# Patient Record
Sex: Female | Born: 1995 | Hispanic: No | Marital: Single | Smoking: Never smoker
Health system: Southern US, Community
[De-identification: ages and names within clinical notes are randomized; demographics above are authoritative.]

## PROBLEM LIST (undated history)

## (undated) ENCOUNTER — Inpatient Hospital Stay (HOSPITAL_COMMUNITY): Payer: Self-pay

## (undated) DIAGNOSIS — N200 Calculus of kidney: Secondary | ICD-10-CM

## (undated) HISTORY — PX: NO PAST SURGERIES: SHX2092

---

## 2014-05-22 DIAGNOSIS — N200 Calculus of kidney: Secondary | ICD-10-CM

## 2014-05-22 HISTORY — DX: Calculus of kidney: N20.0

## 2014-10-22 LAB — OB RESULTS CONSOLE HIV ANTIBODY (ROUTINE TESTING): HIV: NONREACTIVE

## 2014-10-22 LAB — OB RESULTS CONSOLE HEPATITIS B SURFACE ANTIGEN: Hepatitis B Surface Ag: NEGATIVE

## 2014-10-22 LAB — OB RESULTS CONSOLE RPR: RPR: NONREACTIVE

## 2014-10-22 LAB — OB RESULTS CONSOLE RUBELLA ANTIBODY, IGM: Rubella: IMMUNE

## 2014-10-22 LAB — OB RESULTS CONSOLE GC/CHLAMYDIA
CHLAMYDIA, DNA PROBE: NEGATIVE
GC PROBE AMP, GENITAL: NEGATIVE

## 2014-12-25 ENCOUNTER — Other Ambulatory Visit (HOSPITAL_COMMUNITY): Payer: Self-pay | Admitting: Obstetrics and Gynecology

## 2014-12-25 ENCOUNTER — Encounter (HOSPITAL_COMMUNITY): Payer: Self-pay | Admitting: Obstetrics and Gynecology

## 2014-12-25 ENCOUNTER — Other Ambulatory Visit (HOSPITAL_COMMUNITY): Payer: Self-pay | Admitting: Maternal and Fetal Medicine

## 2014-12-25 DIAGNOSIS — Z3A27 27 weeks gestation of pregnancy: Secondary | ICD-10-CM

## 2014-12-25 DIAGNOSIS — O99213 Obesity complicating pregnancy, third trimester: Secondary | ICD-10-CM

## 2014-12-25 DIAGNOSIS — Z3689 Encounter for other specified antenatal screening: Secondary | ICD-10-CM

## 2015-01-01 ENCOUNTER — Ambulatory Visit (HOSPITAL_COMMUNITY): Payer: Medicaid Other

## 2015-01-21 ENCOUNTER — Ambulatory Visit (HOSPITAL_COMMUNITY): Payer: Medicaid Other

## 2015-01-21 ENCOUNTER — Other Ambulatory Visit (HOSPITAL_COMMUNITY): Payer: Self-pay | Admitting: Obstetrics and Gynecology

## 2015-01-21 ENCOUNTER — Ambulatory Visit (HOSPITAL_COMMUNITY)
Admission: RE | Admit: 2015-01-21 | Discharge: 2015-01-21 | Disposition: A | Discharge status: Home / Self Care | Payer: Medicaid Other | Source: Ambulatory Visit | Attending: Obstetrics and Gynecology | Admitting: Obstetrics and Gynecology

## 2015-01-21 ENCOUNTER — Encounter (HOSPITAL_COMMUNITY): Payer: Self-pay

## 2015-01-21 DIAGNOSIS — Z3689 Encounter for other specified antenatal screening: Secondary | ICD-10-CM

## 2015-01-21 DIAGNOSIS — Z36 Encounter for antenatal screening of mother: Secondary | ICD-10-CM | POA: Diagnosis not present

## 2015-01-21 DIAGNOSIS — O99213 Obesity complicating pregnancy, third trimester: Secondary | ICD-10-CM | POA: Insufficient documentation

## 2015-01-21 DIAGNOSIS — Z3A27 27 weeks gestation of pregnancy: Secondary | ICD-10-CM

## 2015-02-01 ENCOUNTER — Other Ambulatory Visit (HOSPITAL_COMMUNITY): Payer: Self-pay | Admitting: Obstetrics and Gynecology

## 2015-02-01 ENCOUNTER — Encounter (HOSPITAL_COMMUNITY): Payer: Self-pay | Admitting: Obstetrics and Gynecology

## 2015-02-17 ENCOUNTER — Inpatient Hospital Stay (HOSPITAL_COMMUNITY): Payer: Medicaid Other

## 2015-02-17 ENCOUNTER — Inpatient Hospital Stay (HOSPITAL_COMMUNITY)
Admission: AD | Admit: 2015-02-17 | Discharge: 2015-02-17 | Disposition: A | Discharge status: Home / Self Care | Payer: Medicaid Other | Source: Ambulatory Visit | Attending: Obstetrics and Gynecology | Admitting: Obstetrics and Gynecology

## 2015-02-17 ENCOUNTER — Encounter (HOSPITAL_COMMUNITY): Payer: Self-pay | Admitting: *Deleted

## 2015-02-17 DIAGNOSIS — O26899 Other specified pregnancy related conditions, unspecified trimester: Secondary | ICD-10-CM

## 2015-02-17 DIAGNOSIS — R1011 Right upper quadrant pain: Secondary | ICD-10-CM | POA: Insufficient documentation

## 2015-02-17 DIAGNOSIS — N200 Calculus of kidney: Secondary | ICD-10-CM | POA: Diagnosis not present

## 2015-02-17 DIAGNOSIS — O26893 Other specified pregnancy related conditions, third trimester: Secondary | ICD-10-CM | POA: Diagnosis not present

## 2015-02-17 DIAGNOSIS — R319 Hematuria, unspecified: Secondary | ICD-10-CM

## 2015-02-17 DIAGNOSIS — Z3A31 31 weeks gestation of pregnancy: Secondary | ICD-10-CM | POA: Diagnosis not present

## 2015-02-17 DIAGNOSIS — O9989 Other specified diseases and conditions complicating pregnancy, childbirth and the puerperium: Secondary | ICD-10-CM

## 2015-02-17 DIAGNOSIS — R109 Unspecified abdominal pain: Secondary | ICD-10-CM

## 2015-02-17 LAB — URINALYSIS, ROUTINE W REFLEX MICROSCOPIC
Bilirubin Urine: NEGATIVE
Glucose, UA: NEGATIVE mg/dL
KETONES UR: 15 mg/dL — AB
LEUKOCYTES UA: NEGATIVE
NITRITE: NEGATIVE
PH: 6.5 (ref 5.0–8.0)
Protein, ur: NEGATIVE mg/dL
SPECIFIC GRAVITY, URINE: 1.025 (ref 1.005–1.030)
Urobilinogen, UA: 0.2 mg/dL (ref 0.0–1.0)

## 2015-02-17 LAB — CBC
HCT: 32.5 % — ABNORMAL LOW (ref 36.0–46.0)
Hemoglobin: 11 g/dL — ABNORMAL LOW (ref 12.0–15.0)
MCH: 29.3 pg (ref 26.0–34.0)
MCHC: 33.8 g/dL (ref 30.0–36.0)
MCV: 86.4 fL (ref 78.0–100.0)
Platelets: 194 10*3/uL (ref 150–400)
RBC: 3.76 MIL/uL — ABNORMAL LOW (ref 3.87–5.11)
RDW: 13.3 % (ref 11.5–15.5)
WBC: 12.2 10*3/uL — ABNORMAL HIGH (ref 4.0–10.5)

## 2015-02-17 LAB — COMPREHENSIVE METABOLIC PANEL
ALBUMIN: 2.8 g/dL — AB (ref 3.5–5.0)
ALT: 11 U/L — ABNORMAL LOW (ref 14–54)
ANION GAP: 8 (ref 5–15)
AST: 32 U/L (ref 15–41)
Alkaline Phosphatase: 88 U/L (ref 38–126)
BUN: 9 mg/dL (ref 6–20)
CO2: 21 mmol/L — AB (ref 22–32)
Calcium: 8.2 mg/dL — ABNORMAL LOW (ref 8.9–10.3)
Chloride: 106 mmol/L (ref 101–111)
Creatinine, Ser: 0.59 mg/dL (ref 0.44–1.00)
GFR calc Af Amer: >60 mL/min (ref >60)
GFR calc non Af Amer: >60 mL/min (ref >60)
GLUCOSE: 95 mg/dL (ref 65–99)
POTASSIUM: 4.9 mmol/L (ref 3.5–5.1)
SODIUM: 135 mmol/L (ref 135–145)
Total Bilirubin: 1.1 mg/dL (ref 0.3–1.2)
Total Protein: 6.3 g/dL — ABNORMAL LOW (ref 6.5–8.1)

## 2015-02-17 LAB — URINE MICROSCOPIC-ADD ON

## 2015-02-17 MED ORDER — HYDROMORPHONE HCL 1 MG/ML IJ SOLN
1.0000 mg | Freq: Once | INTRAMUSCULAR | Status: AC
  Administered 2015-02-17: 1 mg via INTRAVENOUS
  Filled 2015-02-17: qty 1

## 2015-02-17 MED ORDER — LACTATED RINGERS IV BOLUS (SEPSIS)
1000.0000 mL | Freq: Once | INTRAVENOUS | Status: AC
Start: 2015-02-17 — End: 2015-02-17
  Administered 2015-02-17: 1000 mL via INTRAVENOUS

## 2015-02-17 MED ORDER — OXYCODONE-ACETAMINOPHEN 5-325 MG PO TABS: 1.0000 | ORAL_TABLET | Freq: Four times a day (QID) | ORAL | Status: DC | PRN

## 2015-02-17 MED ORDER — TAMSULOSIN HCL 0.4 MG PO CAPS: 0.4000 mg | ORAL_CAPSULE | Freq: Every day | ORAL | Status: DC

## 2015-02-17 MED ORDER — ONDANSETRON HCL 4 MG/2ML IJ SOLN
4.0000 mg | Freq: Once | INTRAMUSCULAR | Status: AC
  Administered 2015-02-17: 4 mg via INTRAVENOUS
  Filled 2015-02-17: qty 2

## 2015-02-17 NOTE — Discharge Instructions (Signed)

## 2015-02-17 NOTE — MAU Provider Note (Signed)
History     CSN: [id number]  Arrival date and time: 02/17/15 4098   First Provider Initiated Contact with Patient 02/17/15 0102      Chief Complaint  Patient presents with  . Abdominal Pain   HPI Ms. Tammy Mcclain is a 19 y.o. G1P0 at [redacted]w[redacted]d who presents to MAU today with complaint of mid right sided abdominal pain radiating into the RUQ that started this evening. She states that she called the on-call RN and was told to try Tylenol. She took 2 Tylenol around 2200 today with some initial relief. Pain returned and became worse by 2300. She states that she then began to have associated N/V. She denies fever or contractions. She states that the pain is constant and rated at 10/10 now. She denies vaginal bleeding or LOF. She reports good fetal movement.   OB History    Gravida Para Term Preterm AB TAB SAB Ectopic Multiple Living   1               History reviewed. No pertinent past medical history.  History reviewed. No pertinent past surgical history.  History reviewed. No pertinent family history.  Social History  Substance Use Topics  . Smoking status: Never Smoker   . Smokeless tobacco: Never Used  . Alcohol Use: No    Allergies: No Known Allergies  No prescriptions prior to admission    Review of Systems  Constitutional: Negative for fever and malaise/fatigue.  Gastrointestinal: Positive for nausea, vomiting and abdominal pain. Negative for diarrhea and constipation.  Genitourinary: Negative for dysuria, urgency and frequency.       Neg - vaginal bleeding, discharge, LOF   Physical Exam   Blood pressure 99/59, pulse 101, temperature 98 F (36.7 C), resp. rate 18, last menstrual period 07/14/2014.  Physical Exam  Nursing note and vitals reviewed. Constitutional: She is oriented to person, place, and time. She appears well-developed and well-nourished. No distress.  HENT:  Head: Normocephalic and atraumatic.  Cardiovascular: Normal rate.   Respiratory:  Effort normal.  GI: Soft. She exhibits no distension and no mass. There is tenderness (moderate tenderness to palpation of the right mid abdomen and RUQ). There is no rebound and no guarding.  Neurological: She is alert and oriented to person, place, and time.  Skin: Skin is warm and dry. No erythema.  Psychiatric: She has a normal mood and affect.  Dilation: Closed Effacement (%): Thick Exam by:: Vonzella Nipple PA   Results for orders placed or performed during the hospital encounter of 02/17/15 (from the past 24 hour(s))  CBC     Status: Abnormal   Collection Time: 02/17/15  1:10 AM  Result Value Ref Range   WBC 12.2 (H) 4.0 - 10.5 K/uL   RBC 3.76 (L) 3.87 - 5.11 MIL/uL   Hemoglobin 11.0 (L) 12.0 - 15.0 g/dL   HCT 11.9 (L) 14.7 - 82.9 %   MCV 86.4 78.0 - 100.0 fL   MCH 29.3 26.0 - 34.0 pg   MCHC 33.8 30.0 - 36.0 g/dL   RDW 56.2 13.0 - 86.5 %   Platelets 194 150 - 400 K/uL  Comprehensive metabolic panel     Status: Abnormal   Collection Time: 02/17/15  1:10 AM  Result Value Ref Range   Sodium 135 135 - 145 mmol/L   Potassium 4.9 3.5 - 5.1 mmol/L   Chloride 106 101 - 111 mmol/L   CO2 21 (L) 22 - 32 mmol/L   Glucose, Bld 95 65 -  99 mg/dL   BUN 9 6 - 20 mg/dL   Creatinine, Ser 1.61 0.44 - 1.00 mg/dL   Calcium 8.2 (L) 8.9 - 10.3 mg/dL   Total Protein 6.3 (L) 6.5 - 8.1 g/dL   Albumin 2.8 (L) 3.5 - 5.0 g/dL   AST 32 15 - 41 U/L   ALT 11 (L) 14 - 54 U/L   Alkaline Phosphatase 88 38 - 126 U/L   Total Bilirubin 1.1 0.3 - 1.2 mg/dL   GFR calc non Af Amer >60 >60 mL/min   GFR calc Af Amer >60 >60 mL/min   Anion gap 8 5 - 15  Urinalysis, Routine w reflex microscopic (not at [facility])     Status: Abnormal   Collection Time: 02/17/15  2:25 AM  Result Value Ref Range   Color, Urine YELLOW YELLOW   APPearance CLEAR CLEAR   Specific Gravity, Urine 1.025 1.005 - 1.030   pH 6.5 5.0 - 8.0   Glucose, UA NEGATIVE NEGATIVE mg/dL   Hgb urine dipstick LARGE (A) NEGATIVE   Bilirubin Urine  NEGATIVE NEGATIVE   Ketones, ur 15 (A) NEGATIVE mg/dL   Protein, ur NEGATIVE NEGATIVE mg/dL   Urobilinogen, UA 0.2 0.0 - 1.0 mg/dL   Nitrite NEGATIVE NEGATIVE   Leukocytes, UA NEGATIVE NEGATIVE  Urine microscopic-add on     Status: None   Collection Time: 02/17/15  2:25 AM  Result Value Ref Range   Squamous Epithelial / LPF RARE RARE   WBC, UA 0-2 <3 WBC/hpf   RBC / HPF 21-50 <3 RBC/hpf   Bacteria, UA RARE RARE   US Renal  02/17/2015   CLINICAL DATA:  Hematuria.  Right-sided flank pain  EXAM: RENAL / URINARY TRACT ULTRASOUND COMPLETE  COMPARISON:  None.  FINDINGS: Right Kidney:  Length: 11 cm. Mild to moderate hydronephrosis without visualized obstructive process. No sonographic evidence of nephrolithiasis. No mass or fluid collection. Although hydronephrosis could be from gravid uterus, given the hematuria and flank pain this is more concerning for a ureteral calculus.  Left Kidney:  Length: 11 cm. Echogenicity within normal limits. No mass or hydronephrosis visualized.  Bladder:  Completely decompressed and not evaluated. Unfortunately, this precludes detection of ureteral jets.  IMPRESSION: Right hydronephrosis without visualized obstruction, as above.   Electronically Signed   By: Marnee Spring M.D.   On: 02/17/2015 03:36   US Abdomen Limited Ruq  02/17/2015   CLINICAL DATA:  Abdominal pain in third trimester pregnancy.  EXAM: US ABDOMEN LIMITED - RIGHT UPPER QUADRANT  COMPARISON:  None.  FINDINGS: Gallbladder:  No gallstones or wall thickening visualized. No sonographic Murphy sign noted.  Common bile duct:  Diameter: 4 mm  Liver:  Limited evaluation due to narrow sonographic windows. No focal lesion identified. Within normal limits in parenchymal echogenicity.  IMPRESSION: Negative right upper quadrant ultrasound.   Electronically Signed   By: Marnee Spring M.D.   On: 02/17/2015 02:22    Fetal Monitoring: Baseline: 120 bpm, moderate variability, + accelerations, no  decelerations Contractions: none  MAU Course  Procedures None  MDM UA, CBC, CMP, US OB Limited and [place] RUQ abdomen limited today IV LR bolus with 4 mg IV Zofran and 1 mg IV Dilaudid given US shows normal AFI and FHR RUQ [place] results above Patient reports significant improvement in pain, no additional emesis Discussed with Dr. Jackelyn Knife. He recommends patient attempt a PO challenge. If able to tolerate PO patient may be discharged with Rx for Percocet and Flomax and follow-up  in the office as scheduled or sooner PRN.   Assessment and Plan  A: SIUP at [redacted]w[redacted]d Right kidney stone  P: Discharge home Rx for Flomax and Percocet given to patient Warning signs for worsening condition discussed Patient advised to follow-up with [facility] as scheduled for routine prenatal care or sooner PRN Patient may return to MAU as needed or if her condition were to change or worsen   Marny Lowenstein, PA-C  02/17/2015, 3:39 AM

## 2015-02-17 NOTE — MAU Note (Signed)
Pt presents to MAU with complaints of pain her there lower right side of her abdomen since 10 pm tonight. Also states that she started vomiting shortly after 10. Denies any vaginal bleeding or discharge

## 2015-02-22 NOTE — Progress Notes (Signed)
FHT from 9-28 reviewed.  Reactive NST, no significant decels, no regular ctx

## 2015-03-18 ENCOUNTER — Ambulatory Visit (INDEPENDENT_AMBULATORY_CARE_PROVIDER_SITE_OTHER): Payer: Self-pay | Admitting: Pediatrics

## 2015-03-18 DIAGNOSIS — Z7681 Expectant parent(s) prebirth pediatrician visit: Secondary | ICD-10-CM

## 2015-03-18 DIAGNOSIS — Z3490 Encounter for supervision of normal pregnancy, unspecified, unspecified trimester: Secondary | ICD-10-CM | POA: Insufficient documentation

## 2015-03-18 NOTE — Progress Notes (Signed)
Prenatal counseling for impending newborn done  

## 2015-03-25 LAB — OB RESULTS CONSOLE GBS: STREP GROUP B AG: POSITIVE

## 2015-04-21 ENCOUNTER — Inpatient Hospital Stay (HOSPITAL_COMMUNITY)
Admission: AD | Admit: 2015-04-21 | Discharge: 2015-04-25 | DRG: 765 | Disposition: A | Discharge status: Home / Self Care | Payer: Medicaid Other | Source: Ambulatory Visit | Attending: Obstetrics and Gynecology | Admitting: Obstetrics and Gynecology

## 2015-04-21 ENCOUNTER — Inpatient Hospital Stay (HOSPITAL_COMMUNITY): Payer: Medicaid Other | Admitting: Anesthesiology

## 2015-04-21 ENCOUNTER — Encounter (HOSPITAL_COMMUNITY): Payer: Self-pay | Admitting: *Deleted

## 2015-04-21 DIAGNOSIS — O99214 Obesity complicating childbirth: Secondary | ICD-10-CM | POA: Diagnosis present

## 2015-04-21 DIAGNOSIS — O99824 Streptococcus B carrier state complicating childbirth: Secondary | ICD-10-CM | POA: Diagnosis present

## 2015-04-21 DIAGNOSIS — Z6841 Body Mass Index (BMI) 40.0 and over, adult: Secondary | ICD-10-CM

## 2015-04-21 DIAGNOSIS — O480 Post-term pregnancy: Secondary | ICD-10-CM | POA: Diagnosis present

## 2015-04-21 DIAGNOSIS — Z3A40 40 weeks gestation of pregnancy: Secondary | ICD-10-CM | POA: Diagnosis not present

## 2015-04-21 DIAGNOSIS — O2653 Maternal hypotension syndrome, third trimester: Secondary | ICD-10-CM | POA: Diagnosis present

## 2015-04-21 DIAGNOSIS — Z3400 Encounter for supervision of normal first pregnancy, unspecified trimester: Secondary | ICD-10-CM

## 2015-04-21 HISTORY — DX: Calculus of kidney: N20.0

## 2015-04-21 LAB — CBC
HCT: 36.1 % (ref 36.0–46.0)
HEMOGLOBIN: 12.3 g/dL (ref 12.0–15.0)
MCH: 28.5 pg (ref 26.0–34.0)
MCHC: 34.1 g/dL (ref 30.0–36.0)
MCV: 83.6 fL (ref 78.0–100.0)
Platelets: 218 10*3/uL (ref 150–400)
RBC: 4.32 MIL/uL (ref 3.87–5.11)
RDW: 13.8 % (ref 11.5–15.5)
WBC: 12 10*3/uL — AB (ref 4.0–10.5)

## 2015-04-21 MED ORDER — EPHEDRINE 5 MG/ML INJ
10.0000 mg | INTRAVENOUS | Status: AC | PRN
  Administered 2015-04-21 (×2): 5 mg via INTRAVENOUS
  Filled 2015-04-21: qty 4

## 2015-04-21 MED ORDER — CITRIC ACID-SODIUM CITRATE 334-500 MG/5ML PO SOLN
30.0000 mL | ORAL | Status: DC | PRN
  Administered 2015-04-22: 30 mL via ORAL
  Filled 2015-04-21: qty 15

## 2015-04-21 MED ORDER — BUTORPHANOL TARTRATE 1 MG/ML IJ SOLN: 1.0000 mg | INTRAMUSCULAR | Status: DC | PRN

## 2015-04-21 MED ORDER — ONDANSETRON HCL 4 MG/2ML IJ SOLN: 4.0000 mg | Freq: Four times a day (QID) | INTRAMUSCULAR | Status: DC | PRN

## 2015-04-21 MED ORDER — EPHEDRINE 5 MG/ML INJ
10.0000 mg | Freq: Once | INTRAVENOUS | Status: AC
  Administered 2015-04-21: 10 mg via INTRAVENOUS

## 2015-04-21 MED ORDER — OXYTOCIN BOLUS FROM INFUSION
500.0000 mL | INTRAVENOUS | Status: DC
Start: 2015-04-21 — End: 2015-04-22

## 2015-04-21 MED ORDER — PHENYLEPHRINE 40 MCG/ML (10ML) SYRINGE FOR IV PUSH (FOR BLOOD PRESSURE SUPPORT)
80.0000 ug | PREFILLED_SYRINGE | INTRAVENOUS | Status: AC | PRN
  Administered 2015-04-21: 120 ug via INTRAVENOUS
  Administered 2015-04-21 (×2): 80 ug via INTRAVENOUS
  Filled 2015-04-21: qty 20

## 2015-04-21 MED ORDER — TERBUTALINE SULFATE 1 MG/ML IJ SOLN
0.2500 mg | Freq: Once | INTRAMUSCULAR | Status: AC | PRN
  Administered 2015-04-22: 0.25 mg via SUBCUTANEOUS

## 2015-04-21 MED ORDER — PENICILLIN G POTASSIUM 5000000 UNITS IJ SOLR
2.5000 10*6.[IU] | INTRAVENOUS | Status: DC
  Administered 2015-04-21 (×3): 2.5 10*6.[IU] via INTRAVENOUS
  Filled 2015-04-21 (×7): qty 2.5

## 2015-04-21 MED ORDER — LIDOCAINE HCL (PF) 1 % IJ SOLN: 30.0000 mL | INTRAMUSCULAR | Status: DC | PRN

## 2015-04-21 MED ORDER — OXYTOCIN 40 UNITS IN LACTATED RINGERS INFUSION - SIMPLE MED
1.0000 m[IU]/min | INTRAVENOUS | Status: DC
  Administered 2015-04-21: 2 m[IU]/min via INTRAVENOUS
  Filled 2015-04-21: qty 1000

## 2015-04-21 MED ORDER — LACTATED RINGERS IV SOLN
INTRAVENOUS | Status: DC
  Administered 2015-04-21 – 2015-04-22 (×7): via INTRAVENOUS

## 2015-04-21 MED ORDER — LIDOCAINE HCL (PF) 1 % IJ SOLN
INTRAMUSCULAR | Status: DC | PRN
  Administered 2015-04-21 (×2): 4 mL

## 2015-04-21 MED ORDER — PENICILLIN G POTASSIUM 5000000 UNITS IJ SOLR
5.0000 10*6.[IU] | Freq: Once | INTRAVENOUS | Status: AC
  Administered 2015-04-21: 5 10*6.[IU] via INTRAVENOUS
  Filled 2015-04-21: qty 5

## 2015-04-21 MED ORDER — PHENYLEPHRINE 40 MCG/ML (10ML) SYRINGE FOR IV PUSH (FOR BLOOD PRESSURE SUPPORT)
80.0000 ug | PREFILLED_SYRINGE | Freq: Three times a day (TID) | INTRAVENOUS | Status: DC | PRN
  Administered 2015-04-21: 80 ug via INTRAVENOUS
  Administered 2015-04-21: 40 ug via INTRAVENOUS
  Administered 2015-04-21: 80 ug via INTRAVENOUS
  Filled 2015-04-21: qty 2
  Filled 2015-04-21: qty 20

## 2015-04-21 MED ORDER — OXYTOCIN 40 UNITS IN LACTATED RINGERS INFUSION - SIMPLE MED
62.5000 mL/h | INTRAVENOUS | Status: DC
Start: 2015-04-21 — End: 2015-04-22

## 2015-04-21 MED ORDER — LACTATED RINGERS IV SOLN
500.0000 mL | INTRAVENOUS | Status: DC | PRN
  Administered 2015-04-21: 300 mL via INTRAVENOUS
  Administered 2015-04-21: 500 mL via INTRAVENOUS
  Administered 2015-04-21: 300 mL via INTRAVENOUS
  Administered 2015-04-21: 500 mL via INTRAVENOUS

## 2015-04-21 MED ORDER — EPHEDRINE SULFATE 50 MG/ML IJ SOLN
INTRAMUSCULAR | Status: AC
  Filled 2015-04-21: qty 1

## 2015-04-21 MED ORDER — FENTANYL 2.5 MCG/ML BUPIVACAINE 1/10 % EPIDURAL INFUSION (WH - ANES)
14.0000 mL/h | INTRAMUSCULAR | Status: DC | PRN
  Administered 2015-04-21 – 2015-04-22 (×2): 14 mL/h via EPIDURAL
  Filled 2015-04-21 (×2): qty 125

## 2015-04-21 MED ORDER — OXYCODONE-ACETAMINOPHEN 5-325 MG PO TABS: 2.0000 | ORAL_TABLET | ORAL | Status: DC | PRN

## 2015-04-21 MED ORDER — OXYCODONE-ACETAMINOPHEN 5-325 MG PO TABS: 1.0000 | ORAL_TABLET | ORAL | Status: DC | PRN

## 2015-04-21 MED ORDER — EPHEDRINE SULFATE 50 MG/ML IJ SOLN
INTRAMUSCULAR | Status: DC | PRN
  Administered 2015-04-21: 25 mg via INTRAMUSCULAR

## 2015-04-21 MED ORDER — DIPHENHYDRAMINE HCL 50 MG/ML IJ SOLN: 12.5000 mg | INTRAMUSCULAR | Status: DC | PRN

## 2015-04-21 NOTE — Progress Notes (Signed)
Feeling some ctx Afeb, VSS FHT- Cat I VE-3/80/-2, vtx, AROM clear Continue pitocin and PCN, monitor progress

## 2015-04-21 NOTE — Anesthesia Preprocedure Evaluation (Signed)
Anesthesia Evaluation  Patient identified by MRN, date of birth, ID band Patient awake    Reviewed: Allergy & Precautions, NPO status , Patient's Chart, lab work & pertinent test results  History of Anesthesia Complications Negative for: history of anesthetic complications  Airway Mallampati: II  TM Distance: >3 FB Neck ROM: Full    Dental no notable dental hx. (+) Dental Advisory Given   Pulmonary neg pulmonary ROS,    Pulmonary exam normal breath sounds clear to auscultation       Cardiovascular negative cardio ROS Normal cardiovascular exam Rhythm:Regular Rate:Normal     Neuro/Psych negative neurological ROS  negative psych ROS   GI/Hepatic negative GI ROS, Neg liver ROS,   Endo/Other  Morbid obesity  Renal/GU negative Renal ROS  negative genitourinary   Musculoskeletal negative musculoskeletal ROS (+)   Abdominal   Peds negative pediatric ROS (+)  Hematology negative hematology ROS (+)   Anesthesia Other Findings   Reproductive/Obstetrics (+) Pregnancy                             Anesthesia Physical Anesthesia Plan  ASA: III  Anesthesia Plan: Epidural   Post-op Pain Management:    Induction:   Airway Management Planned:   Additional Equipment:   Intra-op Plan:   Post-operative Plan:   Informed Consent: I have reviewed the patients History and Physical, chart, labs and discussed the procedure including the risks, benefits and alternatives for the proposed anesthesia with the patient or authorized representative who has indicated his/her understanding and acceptance.   Dental advisory given  Plan Discussed with: CRNA  Anesthesia Plan Comments:         Anesthesia Quick Evaluation  

## 2015-04-21 NOTE — H&P (Signed)
Tammy Mcclain Whichard is a 19 y.o. female, G1P0, [facility]redacted[facility] for induction with favorable cervix.  Prenatal care essentially uncomplicated.  Maternal Medical History:  Fetal activity: Perceived fetal activity is normal.    Prenatal complications: no prenatal complications   OB History    Gravida Para Term Preterm AB TAB SAB Ectopic Multiple Living   1              Past Medical History  Diagnosis Date  . Kidney stones 2016   Past Surgical History  Procedure Laterality Date  . No past surgeries     Family History: family history is not on file. Social History:  reports that she has never smoked. She has never used smokeless tobacco. She reports that she does not drink alcohol or use illicit drugs.   Prenatal Transfer Tool  Maternal Diabetes: No Genetic Screening: Normal Maternal Ultrasounds/Referrals: Normal Fetal Ultrasounds or other Referrals:  None Maternal Substance Abuse:  No Significant Maternal Medications:  None Significant Maternal Lab Results:  Lab values include: Group B Strep positive Other Comments:  None  Review of Systems  Respiratory: Negative.   Cardiovascular: Negative.     Dilation: 2.5 Effacement (%): 60 Station: -2 Exam by:: Honeycutt, RN Blood pressure 124/70, pulse 109, temperature 99 F (37.2 C), temperature source Oral, resp. rate 18, height 5\' 2"  (1.575 m), weight 134.265 kg (296 lb), last menstrual period 07/14/2014. Maternal Exam:  Uterine Assessment: Contraction strength is mild.  Contraction frequency is irregular.   Abdomen: Patient reports no abdominal tenderness. Estimated fetal weight is 7 lbs.   Fetal presentation: vertex  Introitus: Normal vulva. Normal vagina.  Amniotic fluid character: not assessed.  Pelvis: adequate for delivery.   Cervix: Cervix evaluated by digital exam.     Fetal Exam Fetal Monitor Review: Mode: ultrasound.   Variability: moderate (6-25 bpm).   Pattern: accelerations present  and no decelerations.    Fetal State Assessment: Category I - tracings are normal.     Physical Exam  Vitals reviewed. Constitutional: She appears well-developed and well-nourished.  Cardiovascular: Normal rate, regular rhythm and normal heart sounds.   No murmur heard. Respiratory: Effort normal and breath sounds normal. No respiratory distress. She has no wheezes.  GI: Soft.    Prenatal labs: ABO, Rh:  A neg Antibody:  neg Rubella:  Immune RPR:   NR HBsAg:   Neg HIV:   NR GBS:  Pos   Assessment/Plan: IUP at 40 weeks, favorable cervix, +GBS, for induction.  On pitocin and PCN, will monitor progress, check cervix shortly for probable AROM.   Autumne Kallio D 04/21/2015, 12:29 PM

## 2015-04-21 NOTE — Progress Notes (Signed)
Comfortable with epidural Afeb, VSS FHT- Cat II, mod variability, intermittent variable decels recently, had issues from hypotension after epidural, + accels VE-4/80/-2, vtx, IUPC inserted, fluid is light meconium Continue pitocin and PCN, monitor progress

## 2015-04-21 NOTE — Anesthesia Procedure Notes (Signed)
Epidural Patient location during procedure: OB  Staffing Anesthesiologist: Katherine Tout Performed by: anesthesiologist   Preanesthetic Checklist Completed: patient identified, site marked, surgical consent, pre-op evaluation, timeout performed, IV checked, risks and benefits discussed and monitors and equipment checked  Epidural Patient position: sitting Prep: site prepped and draped and DuraPrep Patient monitoring: continuous pulse ox and blood pressure Approach: midline Location: L3-L4 Injection technique: LOR saline  Needle:  Needle type: Tuohy  Needle gauge: 17 G Needle length: 9 cm and 9 Needle insertion depth: 9 cm Catheter type: closed end flexible Catheter size: 19 Gauge Catheter at skin depth: 15 cm Test dose: negative  Assessment Events: blood not aspirated, injection not painful, no injection resistance, negative IV test and no paresthesia  Additional Notes Patient identified. Risks/Benefits/Options discussed with patient including but not limited to bleeding, infection, nerve damage, paralysis, failed block, incomplete pain control, headache, blood pressure changes, nausea, vomiting, reactions to medication both or allergic, itching and postpartum back pain. Confirmed with bedside nurse the patient's most recent platelet count. Confirmed with patient that they are not currently taking any anticoagulation, have any bleeding history or any family history of bleeding disorders. Patient expressed understanding and wished to proceed. All questions were answered. Sterile technique was used throughout the entire procedure. Please see nursing notes for vital signs. Test dose was given through epidural catheter and negative prior to continuing to dose epidural or start infusion. Warning signs of high block given to the patient including shortness of breath, tingling/numbness in hands, complete motor block, or any concerning symptoms with instructions to call for help. Patient was  given instructions on fall risk and not to get out of bed. All questions and concerns addressed with instructions to call with any issues or inadequate analgesia.      

## 2015-04-22 ENCOUNTER — Encounter (HOSPITAL_COMMUNITY): Payer: Self-pay | Admitting: Anesthesiology

## 2015-04-22 ENCOUNTER — Encounter (HOSPITAL_COMMUNITY)
Admission: AD | Disposition: A | Discharge status: Home / Self Care | Payer: Self-pay | Source: Ambulatory Visit | Attending: Obstetrics and Gynecology

## 2015-04-22 LAB — RPR: RPR: NONREACTIVE

## 2015-04-22 LAB — CBC
HCT: 28.9 % — ABNORMAL LOW (ref 36.0–46.0)
HEMOGLOBIN: 9.7 g/dL — AB (ref 12.0–15.0)
MCH: 28.4 pg (ref 26.0–34.0)
MCHC: 33.6 g/dL (ref 30.0–36.0)
MCV: 84.8 fL (ref 78.0–100.0)
Platelets: 160 10*3/uL (ref 150–400)
RBC: 3.41 MIL/uL — AB (ref 3.87–5.11)
RDW: 13.9 % (ref 11.5–15.5)
WBC: 14.6 10*3/uL — ABNORMAL HIGH (ref 4.0–10.5)

## 2015-04-22 SURGERY — Surgical Case
Anesthesia: Epidural

## 2015-04-22 MED ORDER — LANOLIN HYDROUS EX OINT: 1.0000 "application " | TOPICAL_OINTMENT | CUTANEOUS | Status: DC | PRN

## 2015-04-22 MED ORDER — SODIUM CHLORIDE 0.9 % IJ SOLN: 3.0000 mL | INTRAMUSCULAR | Status: DC | PRN

## 2015-04-22 MED ORDER — PROMETHAZINE HCL 25 MG/ML IJ SOLN: 6.2500 mg | INTRAMUSCULAR | Status: DC | PRN

## 2015-04-22 MED ORDER — ONDANSETRON HCL 4 MG/2ML IJ SOLN
INTRAMUSCULAR | Status: AC
  Filled 2015-04-22: qty 2

## 2015-04-22 MED ORDER — ONDANSETRON HCL 4 MG/2ML IJ SOLN: 4.0000 mg | Freq: Three times a day (TID) | INTRAMUSCULAR | Status: DC | PRN

## 2015-04-22 MED ORDER — LIDOCAINE-EPINEPHRINE (PF) 2 %-1:200000 IJ SOLN
INTRAMUSCULAR | Status: AC
  Filled 2015-04-22: qty 20

## 2015-04-22 MED ORDER — PHENYLEPHRINE HCL 10 MG/ML IJ SOLN
INTRAMUSCULAR | Status: DC | PRN
  Administered 2015-04-22 (×2): 80 ug via INTRAVENOUS
  Administered 2015-04-22: 40 ug via INTRAVENOUS

## 2015-04-22 MED ORDER — OXYTOCIN 40 UNITS IN LACTATED RINGERS INFUSION - SIMPLE MED
62.5000 mL/h | INTRAVENOUS | Status: AC
  Administered 2015-04-22: 62.5 mL/h via INTRAVENOUS
  Filled 2015-04-22: qty 1000

## 2015-04-22 MED ORDER — PRENATAL MULTIVITAMIN CH
1.0000 | ORAL_TABLET | Freq: Every day | ORAL | Status: DC
  Administered 2015-04-22 – 2015-04-25 (×4): 1 via ORAL
  Filled 2015-04-22 (×4): qty 1

## 2015-04-22 MED ORDER — MAGNESIUM HYDROXIDE 400 MG/5ML PO SUSP: 30.0000 mL | ORAL | Status: DC | PRN

## 2015-04-22 MED ORDER — LACTATED RINGERS IV SOLN
INTRAVENOUS | Status: DC
Start: 2015-04-22 — End: 2015-04-25
  Administered 2015-04-22 – 2015-04-23 (×2): via INTRAVENOUS

## 2015-04-22 MED ORDER — ZOLPIDEM TARTRATE 5 MG PO TABS: 5.0000 mg | ORAL_TABLET | Freq: Every evening | ORAL | Status: DC | PRN

## 2015-04-22 MED ORDER — LACTATED RINGERS IV SOLN
INTRAVENOUS | Status: DC | PRN
  Administered 2015-04-22 (×2): via INTRAVENOUS

## 2015-04-22 MED ORDER — HYDROMORPHONE HCL 2 MG PO TABS: 2.0000 mg | ORAL_TABLET | ORAL | Status: DC | PRN

## 2015-04-22 MED ORDER — SCOPOLAMINE 1 MG/3DAYS TD PT72
MEDICATED_PATCH | TRANSDERMAL | Status: AC
  Filled 2015-04-22: qty 1

## 2015-04-22 MED ORDER — NALOXONE HCL 0.4 MG/ML IJ SOLN: 0.4000 mg | INTRAMUSCULAR | Status: DC | PRN

## 2015-04-22 MED ORDER — ONDANSETRON HCL 4 MG/2ML IJ SOLN
INTRAMUSCULAR | Status: DC | PRN
Start: 2015-04-22 — End: 2015-04-22
  Administered 2015-04-22: 4 mg via INTRAVENOUS

## 2015-04-22 MED ORDER — FENTANYL CITRATE (PF) 100 MCG/2ML IJ SOLN
INTRAMUSCULAR | Status: DC | PRN
  Administered 2015-04-22: 100 ug via INTRAVENOUS

## 2015-04-22 MED ORDER — SODIUM BICARBONATE 8.4 % IV SOLN
INTRAVENOUS | Status: AC
  Filled 2015-04-22: qty 50

## 2015-04-22 MED ORDER — MEPERIDINE HCL 25 MG/ML IJ SOLN: 6.2500 mg | INTRAMUSCULAR | Status: DC | PRN

## 2015-04-22 MED ORDER — ACETAMINOPHEN 325 MG PO TABS
650.0000 mg | ORAL_TABLET | ORAL | Status: DC | PRN
  Administered 2015-04-23 – 2015-04-24 (×7): 650 mg via ORAL
  Filled 2015-04-22 (×7): qty 2

## 2015-04-22 MED ORDER — MENTHOL 3 MG MT LOZG: 1.0000 | LOZENGE | OROMUCOSAL | Status: DC | PRN

## 2015-04-22 MED ORDER — NALBUPHINE HCL 10 MG/ML IJ SOLN: 5.0000 mg | INTRAMUSCULAR | Status: DC | PRN

## 2015-04-22 MED ORDER — NALBUPHINE HCL 10 MG/ML IJ SOLN: 5.0000 mg | Freq: Once | INTRAMUSCULAR | Status: DC | PRN

## 2015-04-22 MED ORDER — SENNOSIDES-DOCUSATE SODIUM 8.6-50 MG PO TABS
2.0000 | ORAL_TABLET | ORAL | Status: DC
  Administered 2015-04-23 – 2015-04-24 (×2): 2 via ORAL
  Filled 2015-04-22 (×4): qty 2

## 2015-04-22 MED ORDER — DIPHENHYDRAMINE HCL 50 MG/ML IJ SOLN: 12.5000 mg | INTRAMUSCULAR | Status: DC | PRN

## 2015-04-22 MED ORDER — MEASLES, MUMPS & RUBELLA VAC ~~LOC~~ INJ
0.5000 mL | INJECTION | Freq: Once | SUBCUTANEOUS | Status: DC
  Filled 2015-04-22: qty 0.5

## 2015-04-22 MED ORDER — DIBUCAINE 1 % RE OINT: 1.0000 "application " | TOPICAL_OINTMENT | RECTAL | Status: DC | PRN

## 2015-04-22 MED ORDER — KETOROLAC TROMETHAMINE 30 MG/ML IJ SOLN
30.0000 mg | Freq: Once | INTRAMUSCULAR | Status: AC
  Administered 2015-04-22: 30 mg via INTRAVENOUS

## 2015-04-22 MED ORDER — TETANUS-DIPHTH-ACELL PERTUSSIS 5-2.5-18.5 LF-MCG/0.5 IM SUSP: 0.5000 mL | Freq: Once | INTRAMUSCULAR | Status: DC

## 2015-04-22 MED ORDER — SODIUM BICARBONATE 8.4 % IV SOLN
INTRAVENOUS | Status: DC | PRN
  Administered 2015-04-22 (×2): 5 mL via EPIDURAL

## 2015-04-22 MED ORDER — ACETAMINOPHEN 500 MG PO TABS
1000.0000 mg | ORAL_TABLET | Freq: Four times a day (QID) | ORAL | Status: AC
Start: 2015-04-22 — End: 2015-04-23
  Administered 2015-04-22 – 2015-04-23 (×4): 1000 mg via ORAL
  Filled 2015-04-22 (×4): qty 2

## 2015-04-22 MED ORDER — DIPHENHYDRAMINE HCL 25 MG PO CAPS
25.0000 mg | ORAL_CAPSULE | ORAL | Status: DC | PRN
  Administered 2015-04-22: 25 mg via ORAL
  Filled 2015-04-22: qty 1

## 2015-04-22 MED ORDER — CEFAZOLIN SODIUM-DEXTROSE 2-3 GM-% IV SOLR
INTRAVENOUS | Status: AC
  Filled 2015-04-22: qty 50

## 2015-04-22 MED ORDER — SCOPOLAMINE 1 MG/3DAYS TD PT72
MEDICATED_PATCH | TRANSDERMAL | Status: DC | PRN
  Administered 2015-04-22: 1 via TRANSDERMAL

## 2015-04-22 MED ORDER — KETOROLAC TROMETHAMINE 30 MG/ML IJ SOLN
INTRAMUSCULAR | Status: AC
  Filled 2015-04-22: qty 1

## 2015-04-22 MED ORDER — DIPHENHYDRAMINE HCL 25 MG PO CAPS
25.0000 mg | ORAL_CAPSULE | Freq: Four times a day (QID) | ORAL | Status: DC | PRN
  Administered 2015-04-22: 25 mg via ORAL
  Filled 2015-04-22: qty 1

## 2015-04-22 MED ORDER — CEFAZOLIN SODIUM-DEXTROSE 2-3 GM-% IV SOLR
INTRAVENOUS | Status: DC | PRN
  Administered 2015-04-22: 1 g via INTRAVENOUS
  Administered 2015-04-22: 2 g via INTRAVENOUS

## 2015-04-22 MED ORDER — NALBUPHINE HCL 10 MG/ML IJ SOLN
5.0000 mg | INTRAMUSCULAR | Status: DC | PRN
  Administered 2015-04-22: 5 mg via INTRAVENOUS
  Filled 2015-04-22: qty 1

## 2015-04-22 MED ORDER — IBUPROFEN 600 MG PO TABS
600.0000 mg | ORAL_TABLET | Freq: Four times a day (QID) | ORAL | Status: DC
  Administered 2015-04-22 – 2015-04-25 (×12): 600 mg via ORAL
  Filled 2015-04-22 (×12): qty 1

## 2015-04-22 MED ORDER — PHENYLEPHRINE 40 MCG/ML (10ML) SYRINGE FOR IV PUSH (FOR BLOOD PRESSURE SUPPORT)
PREFILLED_SYRINGE | INTRAVENOUS | Status: AC
  Filled 2015-04-22: qty 10

## 2015-04-22 MED ORDER — WITCH HAZEL-GLYCERIN EX PADS
1.0000 | MEDICATED_PAD | CUTANEOUS | Status: DC | PRN
Start: 2015-04-22 — End: 2015-04-25

## 2015-04-22 MED ORDER — NALOXONE HCL 2 MG/2ML IJ SOSY
1.0000 ug/kg/h | PREFILLED_SYRINGE | INTRAMUSCULAR | Status: DC | PRN
  Filled 2015-04-22: qty 2

## 2015-04-22 MED ORDER — LACTATED RINGERS IV BOLUS (SEPSIS)
500.0000 mL | Freq: Once | INTRAVENOUS | Status: AC
  Administered 2015-04-22: 500 mL via INTRAVENOUS

## 2015-04-22 MED ORDER — OXYTOCIN 10 UNIT/ML IJ SOLN
40.0000 [IU] | INTRAMUSCULAR | Status: DC | PRN
  Administered 2015-04-22: 40 [IU] via INTRAVENOUS

## 2015-04-22 MED ORDER — TERBUTALINE SULFATE 1 MG/ML IJ SOLN
INTRAMUSCULAR | Status: AC
  Filled 2015-04-22: qty 1

## 2015-04-22 MED ORDER — MORPHINE SULFATE (PF) 0.5 MG/ML IJ SOLN
INTRAMUSCULAR | Status: DC | PRN
  Administered 2015-04-22: 3 mg via EPIDURAL
  Administered 2015-04-22 (×2): 1 mg via INTRAVENOUS

## 2015-04-22 MED ORDER — OXYTOCIN 10 UNIT/ML IJ SOLN
INTRAMUSCULAR | Status: AC
  Filled 2015-04-22: qty 4

## 2015-04-22 MED ORDER — SIMETHICONE 80 MG PO CHEW
80.0000 mg | CHEWABLE_TABLET | ORAL | Status: DC | PRN
  Administered 2015-04-22 – 2015-04-24 (×3): 80 mg via ORAL
  Filled 2015-04-22 (×3): qty 1

## 2015-04-22 MED ORDER — MORPHINE SULFATE (PF) 0.5 MG/ML IJ SOLN
INTRAMUSCULAR | Status: AC
  Filled 2015-04-22: qty 10

## 2015-04-22 MED ORDER — RHO D IMMUNE GLOBULIN 1500 UNIT/2ML IJ SOSY
300.0000 ug | PREFILLED_SYRINGE | Freq: Once | INTRAMUSCULAR | Status: AC
  Administered 2015-04-22: 300 ug via INTRAVENOUS
  Filled 2015-04-22: qty 2

## 2015-04-22 MED ORDER — FENTANYL CITRATE (PF) 100 MCG/2ML IJ SOLN
INTRAMUSCULAR | Status: AC
  Filled 2015-04-22: qty 2

## 2015-04-22 SURGICAL SUPPLY — 40 items
BENZOIN TINCTURE PRP APPL 2/3 (GAUZE/BANDAGES/DRESSINGS) ×3 IMPLANT
CLAMP CORD UMBIL (MISCELLANEOUS) IMPLANT
CLOSURE WOUND 1/2 X4 (GAUZE/BANDAGES/DRESSINGS) ×1
CLOTH BEACON ORANGE TIMEOUT ST (SAFETY) ×3 IMPLANT
CONTAINER PREFILL 10% NBF 15ML (MISCELLANEOUS) IMPLANT
DRAPE SHEET LG 3/4 BI-LAMINATE (DRAPES) IMPLANT
DRSG OPSITE POSTOP 4X10 (GAUZE/BANDAGES/DRESSINGS) ×3 IMPLANT
DURAPREP 26ML APPLICATOR (WOUND CARE) ×3 IMPLANT
ELECT REM PT RETURN 9FT ADLT (ELECTROSURGICAL) ×3
ELECTRODE REM PT RTRN 9FT ADLT (ELECTROSURGICAL) ×1 IMPLANT
EXTRACTOR VACUUM KIWI (MISCELLANEOUS) IMPLANT
EXTRACTOR VACUUM M CUP 4 TUBE (SUCTIONS) IMPLANT
EXTRACTOR VACUUM M CUP 4' TUBE (SUCTIONS)
GLOVE BIOGEL PI IND STRL 7.0 (GLOVE) ×1 IMPLANT
GLOVE BIOGEL PI INDICATOR 7.0 (GLOVE) ×2
GLOVE ORTHO TXT STRL SZ7.5 (GLOVE) ×3 IMPLANT
GOWN STRL REUS W/TWL LRG LVL3 (GOWN DISPOSABLE) ×6 IMPLANT
KIT ABG SYR 3ML LUER SLIP (SYRINGE) IMPLANT
NEEDLE HYPO 25X5/8 SAFETYGLIDE (NEEDLE) IMPLANT
NS IRRIG 1000ML POUR BTL (IV SOLUTION) ×3 IMPLANT
PACK C SECTION WH (CUSTOM PROCEDURE TRAY) ×3 IMPLANT
PAD OB MATERNITY 4.3X12.25 (PERSONAL CARE ITEMS) ×3 IMPLANT
PENCIL SMOKE EVAC W/HOLSTER (ELECTROSURGICAL) ×3 IMPLANT
RETRACTOR TRAXI PANNICULUS (MISCELLANEOUS) ×1 IMPLANT
RTRCTR C-SECT PINK 25CM LRG (MISCELLANEOUS) ×3 IMPLANT
STRIP CLOSURE SKIN 1/2X4 (GAUZE/BANDAGES/DRESSINGS) ×2 IMPLANT
SUT CHROMIC 1 CTX 36 (SUTURE) ×6 IMPLANT
SUT PLAIN 0 NONE (SUTURE) ×3 IMPLANT
SUT PLAIN 2 0 XLH (SUTURE) ×3 IMPLANT
SUT VIC AB 0 CT1 27 (SUTURE) ×4
SUT VIC AB 0 CT1 27XBRD ANBCTR (SUTURE) ×2 IMPLANT
SUT VIC AB 2-0 CT1 (SUTURE) ×3 IMPLANT
SUT VIC AB 2-0 CT1 27 (SUTURE) ×2
SUT VIC AB 2-0 CT1 TAPERPNT 27 (SUTURE) ×1 IMPLANT
SUT VIC AB 3-0 SH 27 (SUTURE) ×2
SUT VIC AB 3-0 SH 27X BRD (SUTURE) ×1 IMPLANT
SUT VIC AB 4-0 KS 27 (SUTURE) ×3 IMPLANT
TOWEL OR 17X24 6PK STRL BLUE (TOWEL DISPOSABLE) ×3 IMPLANT
TRAXI PANNICULUS RETRACTOR (MISCELLANEOUS) ×2
TRAY FOLEY CATH SILVER 14FR (SET/KITS/TRAYS/PACK) IMPLANT

## 2015-04-22 NOTE — Consult Note (Signed)
Neonatology Note:   Attendance at C-section:   I was asked by Dr. Meisinger to attend this primary C/S at term due to NRFHR. The mother is a G1P0 Aneg, GBS positive with recurrent prolonged FHR delerations. Mother got several doses of Pen G > 4 hours before delivery. ROM 12 hours prior to delivery, fluid with light meconium. Infant vigorous with good spontaneous cry and tone. Delayed cord clamping done. Needed only bulb suctioning. Ap 9/9. Lungs clear to ausc in DR. To CN to care of Pediatrician.  Texanna Hilburn C. Sanya Kobrin, MD 

## 2015-04-22 NOTE — Anesthesia Postprocedure Evaluation (Signed)
Anesthesia Post Note  Patient: Tammy Mcclain  Procedure(s) Performed: Procedure(s) (LRB): CESAREAN SECTION (N/A)  Patient location during evaluation: Mother Baby Anesthesia Type: Epidural Level of consciousness: awake Pain management: satisfactory to patient Vital Signs Assessment: post-procedure vital signs reviewed and stable Respiratory status: spontaneous breathing Cardiovascular status: stable Postop Assessment: no backache and no headache Anesthetic complications: no    Last Vitals:  Filed Vitals:   04/22/15 0515 04/22/15 0615  BP: 133/56 121/56  Pulse: 81 92  Temp: 36.9 C 36.5 C  Resp: 18 18    Last Pain:  Filed Vitals:   04/22/15 0652  PainSc: 1                  Ailey Wessling

## 2015-04-22 NOTE — Anesthesia Postprocedure Evaluation (Signed)
Anesthesia Post Note  Patient: Tammy Mcclain  Procedure(s) Performed: Procedure(s) (LRB): CESAREAN SECTION (N/A)  Patient location during evaluation: Mother Baby Anesthesia Type: Epidural Level of consciousness: awake and alert Pain management: pain level controlled Vital Signs Assessment: post-procedure vital signs reviewed and stable Respiratory status: spontaneous breathing Cardiovascular status: stable Postop Assessment: no headache, no backache, epidural receding, patient able to bend at knees, no signs of nausea or vomiting and adequate PO intake Anesthetic complications: no    Last Vitals:  Filed Vitals:   04/22/15 0515 04/22/15 0615  BP: 133/56 121/56  Pulse: 81 92  Temp: 36.9 C 36.5 C  Resp: 18 18    Last Pain:  Filed Vitals:   04/22/15 0652  PainSc: 1                  Edison PaceWILKERSON,Ragena Fiola

## 2015-04-22 NOTE — Progress Notes (Addendum)
Subjective: Postpartum Day 0: Cesarean Delivery Patient reports incisional pain and tolerating PO.    Objective: Vital signs in last 24 hours: Temp:  [97.7 F (36.5 C)-99.8 F (37.7 C)] 97.9 F (36.6 C) (12/01 0733) Pulse Rate:  [72-183] 88 (12/01 0733) Resp:  [16-23] 18 (12/01 0733) BP: (77-154)/(22-100) 110/73 mmHg (12/01 0733) SpO2:  [94 %-100 %] 97 % (12/01 0733) Weight:  [134.265 kg (296 lb)] 134.265 kg (296 lb) (11/30 1126)  Physical Exam:  General: alert Lochia: appropriate Uterine Fundus: firm Incision: dressing C/D/I  Recent Labs  04/21/15 1120 04/22/15 0659  HGB 12.3 9.7*  HCT 36.1 28.9*    Assessment/Plan: Status post Cesarean section. Doing well postoperatively.  Continue current care, ambulate, d/c foley. Will do circumcision in the office after discharge  Rusti Arizmendi D 04/22/2015, 8:46 AM

## 2015-04-22 NOTE — Progress Notes (Signed)
Comfortable with epidural Afeb, VSS FHT- Cat II, recurrent prolonged decels but moderate variability and accels between ctx, ctx q 2-5 min, pitocin is off due to decels which seem to recur more often when pitocin is on VE-unchanged per RN Discussed that with no significant cervical change and recurrent prolonged decels I recommend c-section.  Discussed c-section procedure and risks, OR notified, will proceed when OR is ready.

## 2015-04-22 NOTE — Lactation Note (Signed)
This note was copied from the chart of [name]. Lactation Consultation Note  Patient Name: [name] ZOXWR'UToday's Date: 04/22/2015 Reason for consult: Difficult latch Baby at 16 hr of life and parents are worried because they report he has only eaten once in lifetime. Mom thinks that baby is not really hungry, he is just trying to spit up again. LC positioned baby in cross cradle and mom was able to latch baby on the second attempt. Baby was sucking well, swallows could be heard, and mom reports a comfortable latch. Encouraged mom to ask for help if she feels like the baby is not eating. Discussed baby behavior, feeding frequency, voids, breast changes, and nipple care. Mom can manually express. Colostrum flowed easily while trying to latch baby. Mom is aware of OP services and support group.     Maternal Data    Feeding Feeding Type: Breast Fed Length of feed: 15 min  LATCH Score/Interventions Latch: Repeated attempts needed to sustain latch, nipple held in mouth throughout feeding, stimulation needed to elicit sucking reflex. Intervention(s): Adjust position;Assist with latch  Audible Swallowing: Spontaneous and intermittent Intervention(s): Skin to skin  Type of Nipple: Everted at rest and after stimulation  Comfort (Breast/Nipple): Soft / non-tender     Hold (Positioning): Assistance needed to correctly position infant at breast and maintain latch. Intervention(s): Support Pillows;Position options  LATCH Score: 8  Lactation Tools Discussed/Used     Consult Status Consult Status: Follow-up Date: 04/23/15 Follow-up type: In-patient    Rulon Eisenmengerlizabeth E Loris Seelye 04/22/2015, 5:35 PM

## 2015-04-22 NOTE — Addendum Note (Signed)
Addendum  created 04/22/15 [id number] by Algis GreenhouseLinda A Lameeka Schleifer, CRNA   Modules edited: Charges VN, Clinical Notes   Clinical Notes:  File: [id number]

## 2015-04-22 NOTE — Transfer of Care (Signed)
Immediate Anesthesia Transfer of Care Note  Patient: Tammy Mcclain  Procedure(s) Performed: Procedure(s): CESAREAN SECTION (N/A)  Patient Location: PACU  Anesthesia Type:Epidural  Level of Consciousness: awake, alert , oriented and patient cooperative  Airway & Oxygen Therapy: Patient Spontanous Breathing  Post-op Assessment: Report given to RN and Post -op Vital signs reviewed and stable  Post vital signs: Reviewed and stable  Last Vitals:  Temp 99.4 BP 92/58 HR 113 RR 18 POX 98  Complications: No apparent anesthesia complications

## 2015-04-22 NOTE — Addendum Note (Signed)
Addendum  created 04/22/15 0820 by Earmon PhoenixValerie P Ressie Slevin, CRNA   Modules edited: Charges VN, Clinical Notes   Clinical Notes:  File: [id number]

## 2015-04-22 NOTE — Lactation Note (Signed)
This note was copied from the chart of Tammy Tammy Mcclain. Lactation Consultation Note  P1.  Reviewed two handed expression.  No drops expressed at this time.  Encouraged mother to continue to hand express before feedings. Set up pillows for football hold and unwrapped baby. Attempted latching baby in football hold.  Tickled his upper lip w/ nipple.  But baby did not open or wake to feed. Mother stated she liked this positioning and will try again in a little while when he cues and after she naps. Reviewed basics, cluster feeding and encouraged  Mom encouraged to feed baby 8-12 times/24 hours and with feeding cues.  Mom made aware of O/P services, breastfeeding support groups, community resources, and our phone # for post-discharge questions.    Patient Name: Tammy Mcclain ZOXWR'UToday's Date: 04/22/2015 Reason for consult: Initial assessment   Maternal Data Has patient been taught Hand Expression?: Yes Does the patient have breastfeeding experience prior to this delivery?: No  Feeding Feeding Type: Breast Fed  LATCH Score/Interventions                      Lactation Tools Discussed/Used     Consult Status Consult Status: Follow-up Date: 04/23/15 Follow-up type: In-patient    Dahlia ByesBerkelhammer, Ruth [facility] Point[facility]oschen 04/22/2015, 9:58 AM

## 2015-04-22 NOTE — Progress Notes (Signed)
UR chart review completed.  

## 2015-04-22 NOTE — Anesthesia Postprocedure Evaluation (Signed)
Anesthesia Post Note  Patient: Tammy Mcclain  Procedure(s) Performed: Procedure(s) (LRB): CESAREAN SECTION (N/A)  Patient location during evaluation: PACU Anesthesia Type: Epidural Level of consciousness: awake, awake and alert and oriented Pain management: pain level controlled Vital Signs Assessment: post-procedure vital signs reviewed and stable Respiratory status: spontaneous breathing, nonlabored ventilation and respiratory function stable Cardiovascular status: stable (SBPs in 80s in PACU. Patient denies dizziness, nausea, feeling faint.) Postop Assessment: no headache, no backache, epidural receding, patient able to bend at knees and no signs of nausea or vomiting Anesthetic complications: no    Last Vitals:  Filed Vitals:   04/22/15 0415 04/22/15 0515  BP: 96/50 133/56  Pulse: 103 81  Temp: 37.2 C 36.9 C  Resp: 18 18    Last Pain:  Filed Vitals:   04/22/15 0535  PainSc: 3                  Cecile HearingStephen Edward Jessieca Rhem

## 2015-04-22 NOTE — Op Note (Signed)
Preoperative diagnosis: Intrauterine pregnancy at 40 weeks, recurrent prolonged FHR decels Postoperative diagnosis: Same Procedure: Primary low transverse cesarean section without extensions Surgeon: Lavina Hammanodd Avnoor Koury M.D. Anesthesia: Epidural  Findings: Patient had normal gravid anatomy and delivered a viable female infant with Apgars of 9 and 9 weight pending Estimated blood loss: 800 cc Specimens: Placenta sent for pathology Complications: None  Procedure in detail: The patient was taken to the operating room and placed in the dorsosupine position. Her previously placed epidural was dosed appropriately.  Panniculus was retracted caudally with a Traxi retractor.   Abdomen was then prepped and draped in the usual sterile fashion, a foley catheter had previously been inserted. The level of her anesthesia was found to be adequate. Abdomen was entered via a standard Pfannenstiel incision. Once the peritoneal cavity was entered the Alexis disposable self-retaining retractor was placed and good visualization was achieved. A 4 cm transverse incision was then made in the lower uterine segment pushing the bladder inferior. Once the uterine cavity was entered the incision was extended digitally. The fetal vertex was grasped and delivered through the incision atraumatically. Mouth and nares were suctioned. The remainder of the infant then delivered atraumatically. Cord was doubly clamped and cut and the infant handed to the awaiting pediatric team. Cord blood was obtained. The placenta delivered spontaneously. Uterus was wiped dry with clean lap pad and all clots and debris were removed. Uterine incision was inspected and found to be free of extensions. Uterine incision was closed in 2 layers with running locking #1 Chromic for the first layer, the second imbricating layer with running #1 Chromic. Tubes and ovaries were inspected and found to be normal. Uterine incision was inspected and found to be hemostatic except  for a small bleeder in the midline which was controlled with a figure 8 of 3-0 Vicryl. Bleeding from serosal edges was controlled with electrocautery. The Alexis retractor was removed. Subfascial space was irrigated and made hemostatic with electrocautery. Peritoneum was closed with 2-0 Vicryl.  Fascia was closed in running fashion starting at both ends and meeting in the middle with 0 Vicryl. Subcutaneous tissue was then irrigated and made hemostatic with electrocautery, then closed with running 2-0 plain gut. Skin was closed with running 4-0 Vicryl subcuticular suture followed by steri-strips and a sterile dressing. Patient tolerated the procedure well and was taken to the recovery in stable condition. Counts were correct x2, she received Ancef 2 g IV at the beginning of the procedure, an additional one gram at the end due to her weight, and she had PAS hose on throughout the procedure.

## 2015-04-23 ENCOUNTER — Encounter (HOSPITAL_COMMUNITY): Payer: Self-pay | Admitting: *Deleted

## 2015-04-23 LAB — RH IG WORKUP (INCLUDES ABO/RH)
ABO/RH(D): A NEG
Fetal Screen: NEGATIVE
Gestational Age(Wks): 40.2
Unit division: 0

## 2015-04-23 NOTE — Progress Notes (Signed)
Subjective: Postpartum Day 1: Cesarean Delivery Patient reports incisional pain and tolerating PO.  Nl lochia, pain controlled  Objective: Vital signs in last 24 hours: Temp:  [97.7 F (36.5 C)-98.4 F (36.9 C)] 97.7 F (36.5 C) (12/02 0513) Pulse Rate:  [60-92] 73 (12/02 0513) Resp:  [16-18] 16 (12/02 0513) BP: (88-109)/(52-58) 88/53 mmHg (12/02 0513) SpO2:  [96 %] 96 % (12/01 1545)  Physical Exam:  General: alert and no distress Lochia: appropriate Uterine Fundus: firm Incision: healing well DVT Evaluation: No evidence of DVT seen on physical exam.   Recent Labs  04/21/15 1120 04/22/15 0659  HGB 12.3 9.7*  HCT 36.1 28.9*    Assessment/Plan: Status post Cesarean section. Doing well postoperatively.  Continue current care.  Mcclain, Tammy Hegstrom 04/23/2015, 8:52 AM

## 2015-04-23 NOTE — Lactation Note (Signed)
This note was copied from the chart of Tammy Mcclain. Discussed baby's weight loss (7% at 23 hrs) with Mom & Dad.  Suggested supplementing after each feeding to prevent significant weight loss over the next 24 hrs.  Mom & Dad were open to supplementing.  I explained finger-feeding, but Mom & Dad stated they would prefer to just give formula in a bottle.  Information given with proper amounts of supplementation and explanation of need to put baby to the breast first, then supplement after feedings.  Mom & Dad stated understanding.

## 2015-04-23 NOTE — Lactation Note (Signed)
This note was copied from the chart of Tammy Mcclain. Lactation Consultation Note Follow up visit at 40 hours of age.  Baby had 7% weight loss at 23 hours of age and has been supplementing after breast feedings through the night and today. Mom reports good feedings at the breast today.  Discussed pumping with mom and she is willing. Mom shown how to use DEBP & how to disassemble, clean, & reassemble parts.Hand expression reviewed and encouraged after pumping for 15 minutes.  Encouraged mom to pump 8X/24 hours while she is supplementing.  FOB plans to buy mom a pump for use at home.  Instructed mom on use of piston hand pump.    Mom to give EBM before formula.  [facility]  Baby asleep with visitor mom began pumping.     Patient Name: Tammy Linus OrnSavanna Gladson JYNWG'NToday's Date: 04/23/2015 Reason for consult: Follow-up assessment   Maternal Data Has patient been taught Hand Expression?: Yes Does the patient have breastfeeding experience prior to this delivery?: No  Feeding Feeding Type: Breast Fed Length of feed: 15 min  LATCH Score/Interventions                Intervention(s): Breastfeeding basics reviewed     Lactation Tools Discussed/Used Pump Review: Setup, frequency, and cleaning;Milk Storage Initiated by:: JS Date initiated:: 04/23/15   Consult Status Consult Status: Follow-up Date: 04/24/15 Follow-up type: In-patient    Beverely RisenShoptaw, Arvella MerlesJana Lynn 04/23/2015, 5:40 PM

## 2015-04-24 ENCOUNTER — Encounter (HOSPITAL_COMMUNITY): Payer: Self-pay | Admitting: *Deleted

## 2015-04-24 LAB — TYPE AND SCREEN
ABO/RH(D): A NEG
ANTIBODY SCREEN: POSITIVE
DAT, IGG: NEGATIVE
Unit division: 0
Unit division: 0

## 2015-04-24 NOTE — Lactation Note (Signed)
This note was copied from the chart of Tammy Mcclain [place]. Lactation Consultation Note  Patient Name: Tammy Mcclain [place] QIONG'EToday's Date: 04/24/2015 Reason for consult: Follow-up assessment;Infant weight loss Baby is 3364 hours old & [facility]. Baby has lost 8.6% @ 47 hours. Mom has been breastfeeding & supplementing with formula. Baby was asleep in crib when LC entered. Mom reports that she hasn't BF or pumped since ~11:30am and only gave formula at the last 2 feedings (35 mL & 42 mL). Mom reports he has latched to her right breast previously but has caused pain & won't latch to her left breast. Mom willing to pump while LC in room - mom pumped for 15 mins on Initiate phase and did some hand expression afterwards - no colostrum was seen. Baby started to wake so mom willing to try BF. Placed baby with mom on left breast in football hold. Baby at first acted like he wanted to feed & had a wide mouth but wouldn't suck and then just sat there. FOB changed a wet diaper & we attempted again but baby would not open his mouth wide. Placed baby on mom & encouraged mom to try BF again if he shows more feeding cues. Encouraged mom to continue to stimulate her breast every 2-3 hours (no more than 4hrs) and to continue offering the breast first and then pumping & hand expression. Mom agreeable. Parents plan to buy a Medela pump because they do not have WIC currently and have been [facility]. Gave mom some comfort gels for her to wear, especially on her right nipple where she is sore. Encouraged mom to [facility] if [facility] wants help with a future latch.  Maternal Data    Feeding Feeding Type: Breast Fed Nipple Type: Slow - flow  LATCH Score/Interventions Latch: Too sleepy or reluctant, no latch achieved, no sucking elicited. Intervention(s): Skin to skin;Teach feeding cues Intervention(s): Adjust position;Assist with latch  Audible  Swallowing: None Intervention(s): Skin to skin;Hand expression  Type of Nipple: Everted at rest and after stimulation  Comfort (Breast/Nipple): Soft / non-tender     Hold (Positioning): Assistance needed to correctly position infant at breast and maintain latch. Intervention(s): Position options;Support Pillows;Skin to skin  LATCH Score: 5  Lactation Tools Discussed/Used Tools: Pump Breast pump type: Double-Electric Breast Pump WIC Program: No   Consult Status Consult Status: Follow-up Date: 04/25/15 Follow-up type: In-patient    Oneal GroutLaura C Sharlynn Seckinger 04/24/2015, 6:37 PM

## 2015-04-24 NOTE — Progress Notes (Signed)
Subjective: Postpartum Day 2: Cesarean Delivery Patient reports incisional pain, tolerating PO and no problems voiding.    Objective: Vital signs in last 24 hours: Temp:  [98.6 F (37 C)] 98.6 F (37 C) (12/03 0648) Pulse Rate:  [70-92] 70 (12/03 0648) Resp:  [16] 16 (12/03 0648) BP: (108-119)/(55-72) 108/55 mmHg (12/03 0648) SpO2:  [100 %] 100 % (12/03 16100648)  Physical Exam:  General: alert and no distress Lochia: appropriate Uterine Fundus: firm Incision: healing well DVT Evaluation: No evidence of DVT seen on physical exam.   Recent Labs  04/21/15 1120 04/22/15 0659  HGB 12.3 9.7*  HCT 36.1 28.9*    Assessment/Plan: Status post Cesarean section. Doing well postoperatively.  Continue current care.  Bovard-Stuckert, Christoffer Currier 04/24/2015, 8:58 AM

## 2015-04-25 MED ORDER — PRENATAL MULTIVITAMIN CH
1.0000 | ORAL_TABLET | Freq: Every day | ORAL | Status: AC
Start: 2015-04-25 — End: ?

## 2015-04-25 MED ORDER — HYDROMORPHONE HCL 2 MG PO TABS: 2.0000 mg | ORAL_TABLET | Freq: Four times a day (QID) | ORAL | Status: DC | PRN

## 2015-04-25 MED ORDER — IBUPROFEN 800 MG PO TABS: 800.0000 mg | ORAL_TABLET | Freq: Three times a day (TID) | ORAL | Status: DC | PRN

## 2015-04-25 NOTE — Lactation Note (Signed)
This note was copied from the chart of Tammy Mcclain Kn[place]. Lactation Consultation Note  Observed mom latching baby using football hold.  Baby latches easily and nurses actively but pulls off frequently.  I believe baby is used to the faster flow of bottle and this should resolve when milk volume increases.  Mom is supplementing with formula due to weight loss.  She is post pumping with DEBP but not obtaining milk.  Reassured patient.  LC services and support reviewed and encouraged.   Patient Name: Tammy Mcclain Pytel ZOXWR'UToday's Date: 04/25/2015 Reason for consult: Follow-up assessment;Infant weight loss   Maternal Data    Feeding Feeding Type: Breast Fed Length of feed: 10 min  LATCH Score/Interventions Latch: Grasps breast easily, tongue down, lips flanged, rhythmical sucking. Intervention(s): Breast massage  Audible Swallowing: A few with stimulation  Type of Nipple: Everted at rest and after stimulation  Comfort (Breast/Nipple): Soft / non-tender     Hold (Positioning): No assistance needed to correctly position infant at breast. Intervention(s): Breastfeeding basics reviewed;Support Pillows  LATCH Score: 9  Lactation Tools Discussed/Used     Consult Status Consult Status: Complete    Jayd Cadieux S 04/25/2015, 11:02 AM

## 2015-04-25 NOTE — Progress Notes (Signed)
Subjective: Postpartum Day 2: Cesarean Delivery Patient reports incisional pain, tolerating PO and no problems voiding.    Objective: Vital signs in last 24 hours: Temp:  [98 F (36.7 C)-98.3 F (36.8 C)] 98 F (36.7 C) (12/04 0650) Pulse Rate:  [75-80] 77 (12/04 0650) Resp:  [17-18] 18 (12/04 0650) BP: (111-130)/(61-84) 111/61 mmHg (12/04 0650) SpO2:  [98 %-100 %] 98 % (12/04 0000)  Physical Exam:  General: alert and no distress Lochia: appropriate Uterine Fundus: firm Incision: healing well DVT Evaluation: No evidence of DVT seen on physical exam.  No results for input(s): HGB, HCT in the last 72 hours.  Assessment/Plan: Status post Cesarean section. Doing well postoperatively.  Discharge home with standard precautions and return to clinic in 2 and 6 weeks. D/C with Motrin, percocet and PNV, f/u 6 week  Bovard-Stuckert, Tammy Mcclain 04/25/2015, 7:55 AM

## 2015-04-25 NOTE — Discharge Summary (Signed)
OB Discharge Summary     Patient Name: Tammy Mcclain DOB: [birth year] MRN: [id number]  Date of admission: 04/21/2015 Delivering MD: Jackelyn Knife, TODD   Date of discharge: 04/25/2015  Admitting diagnosis: 40WKS,INDUCTION Intrauterine pregnancy: [redacted]w[redacted]d     Secondary diagnosis:  Active Problems:   Normal pregnancy, first  Additional problems: N/A     Discharge diagnosis: Term Pregnancy Delivered                                                                                                Post partum procedures:N/A  Augmentation: AROM and Pitocin  Complications: None  Hospital course:  Induction of Labor With Cesarean Section  19 y.o. yo G1P1001 at [redacted]w[redacted]d was admitted to the hospital 04/21/2015 for induction of labor. Patient had a labor course significant for arrest of dilitation, fetal intolerance of labor. The patient went for cesarean section due to Arrest of Dilation and fetal intolerance of labor, and delivered a Viable infant,@BABYSUPPRESS (DBLINK,ept,110,,1,,) Membrane Rupture Time/Date: )1:04 PM ,04/21/2015    of operation can be found in separate operative Note.  Patient had an uncomplicated postpartum course. She is ambulating, tolerating a regular diet, passing flatus, and urinating well.  Patient is discharged home in stable condition on No discharge date for patient encounter.Marland Kitchen                                     Physical exam  Filed Vitals:   04/24/15 0648 04/24/15 1737 04/25/15 0000 04/25/15 0650  BP: 108/55 124/68 130/84 111/61  Pulse: 70 75 80 77  Temp: 98.6 F (37 C) 98.3 F (36.8 C) 98 F (36.7 C) 98 F (36.7 C)  TempSrc: Oral Oral Oral Oral  Resp: Height:      Weight:      SpO2: 100% 100% 98%    General: alert and no distress Lochia: appropriate Uterine Fundus: firm Incision: Healing well with no significant drainage DVT Evaluation: No evidence of DVT seen on physical exam. Labs: Lab Results  Component Value Date   WBC  14.6* 04/22/2015   HGB 9.7* 04/22/2015   HCT 28.9* 04/22/2015   MCV 84.8 04/22/2015   PLT 160 04/22/2015   CMP Latest Ref Rng 02/17/2015  Glucose 65 - 99 mg/dL 95  BUN 6 - 20 mg/dL 9  Creatinine 5.62 - 1.30 mg/dL 8.65  Sodium 784 - 696 mmol/L 135  Potassium 3.5 - 5.1 mmol/L 4.9  Chloride 101 - 111 mmol/L 106  CO2 22 - 32 mmol/L 21(L)  Calcium 8.9 - 10.3 mg/dL 8.2(L)  Total Protein 6.5 - 8.1 g/dL 6.3(L)  Total Bilirubin 0.3 - 1.2 mg/dL 1.1  Alkaline Phos 38 - 126 U/L 88  AST 15 - 41 U/L 32  ALT 14 - 54 U/L 11(L)    Discharge instruction: per After Visit Summary and "Baby and Me Booklet".  After visit meds:    Medication List    TAKE these medications        HYDROmorphone 2 MG  tablet  Commonly known as:  DILAUDID  Take 1 tablet (2 mg total) by mouth every 6 (six) hours as needed for severe pain.     ibuprofen 800 MG tablet  Commonly known as:  ADVIL,MOTRIN  Take 1 tablet (800 mg total) by mouth every 8 (eight) hours as needed.     prenatal multivitamin Tabs tablet  Take 1 tablet by mouth daily at 12 noon.        Diet: routine diet  Activity: Advance as tolerated. Pelvic rest for 6 weeks.   Outpatient follow up:2 weeks and 6 weeks Follow up Appt:No future appointments. Follow up Visit:No Follow-up on file.  Postpartum contraception: Not Discussed  Newborn Data: Live born female  Birth Weight: 7 lb 9.3 oz (3440 g) APGAR: 9, 9  Baby Feeding: Breast Disposition:home with mother   04/25/2015 Sherian ReinBovard-Stuckert, Faatima Tench, MD

## 2017-06-13 ENCOUNTER — Ambulatory Visit: Payer: Self-pay | Admitting: Family Medicine

## 2017-06-13 NOTE — Progress Notes (Deleted)
  No chief complaint on file.   HPI  4 review of systems  Past Medical History:  Diagnosis Date  . Kidney stones 2016    Current Outpatient Medications  Medication Sig Dispense Refill  . HYDROmorphone (DILAUDID) 2 MG tablet Take 1 tablet (2 mg total) by mouth every 6 (six) hours as needed for severe pain. 30 tablet 0  . ibuprofen (ADVIL,MOTRIN) 800 MG tablet Take 1 tablet (800 mg total) by mouth every 8 (eight) hours as needed. 45 tablet 1  . Prenatal Vit-Fe Fumarate-FA (PRENATAL MULTIVITAMIN) TABS tablet Take 1 tablet by mouth daily at 12 noon. 100 tablet 4   No current facility-administered medications for this visit.     Allergies:  Allergies  Allergen Reactions  . Oxycodone Itching    Past Surgical History:  Procedure Laterality Date  . CESAREAN SECTION N/A 04/22/2015   Procedure: CESAREAN SECTION;  Surgeon: Lavina Hammanodd Meisinger, MD;  Location: [facility];  Service: Obstetrics;  Laterality: N/A;  . NO PAST SURGERIES      Social History   Socioeconomic History  . Marital status: Single    Spouse name: Not on file  . Number of children: Not on file  . Years of education: Not on file  . Highest education level: Not on file  Social Needs  . Financial resource strain: Not on file  . Food insecurity - worry: Not on file  . Food insecurity - inability: Not on file  . Transportation needs - medical: Not on file  . Transportation needs - non-medical: Not on file  Occupational History  . Not on file  Tobacco Use  . Smoking status: Never Smoker  . Smokeless tobacco: Never Used  Substance and Sexual Activity  . Alcohol use: No  . Drug use: No  . Sexual activity: Yes  Other Topics Concern  . Not on file  Social History Narrative  . Not on file    No family history on file.   ROS Review of Systems See HPI Constitution: No fevers or chills No malaise No diaphoresis Skin: No rash or itching Eyes: no blurry vision, no double vision GU: no dysuria or hematuria Neuro:  no dizziness or headaches * all others reviewed and negative   Objective: There were no vitals filed for this visit.  Physical Exam  Assessment and Plan There are no diagnoses linked to this encounter.   Tammy Mcclain P PPL Corporationaddy

## 2017-07-01 IMAGING — US US RENAL
1 series · 15 of 25 positions shown · non-contrast
Comparison: None.

CLINICAL DATA: Hematuria.  Right-sided flank pain

EXAM:
RENAL / URINARY TRACT ULTRASOUND COMPLETE

[Series 1: us renal · 15 of 39 slices shown]
[im 1/39]
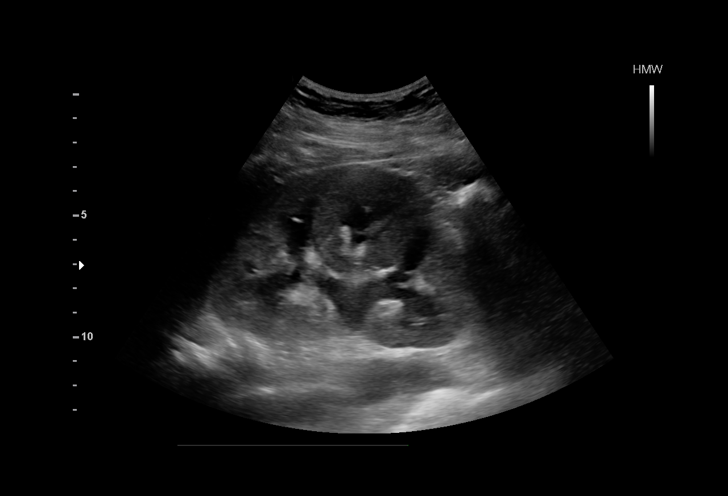
[im 4/39]
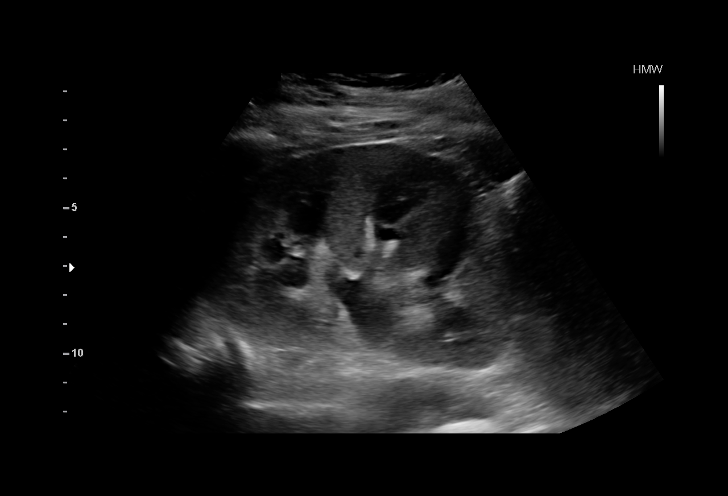
[im 7/39]
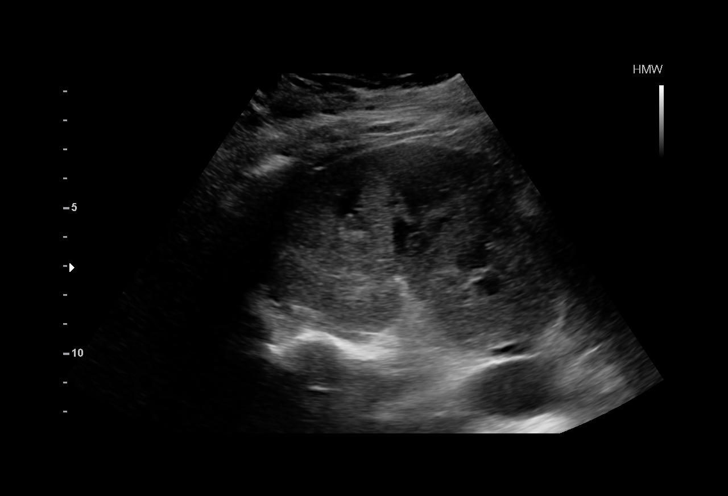
[im 8/39]
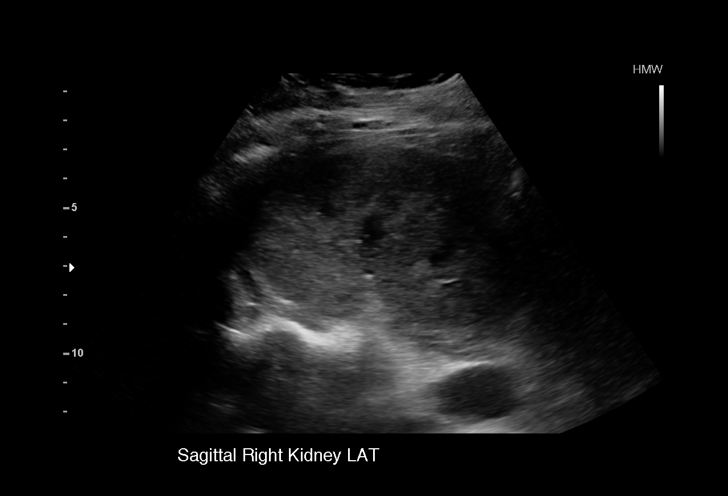
[im 12/39]
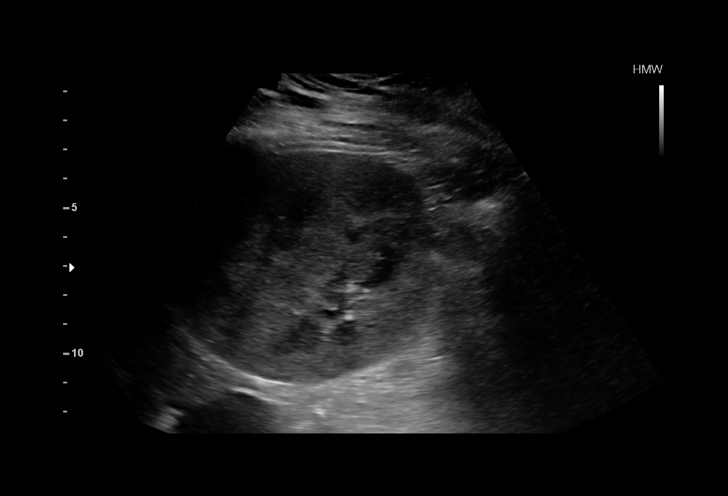
[im 15/39]
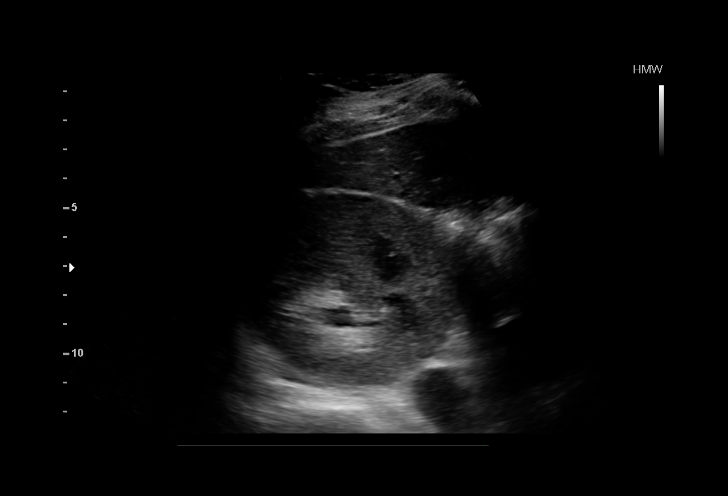
[im 16/39]
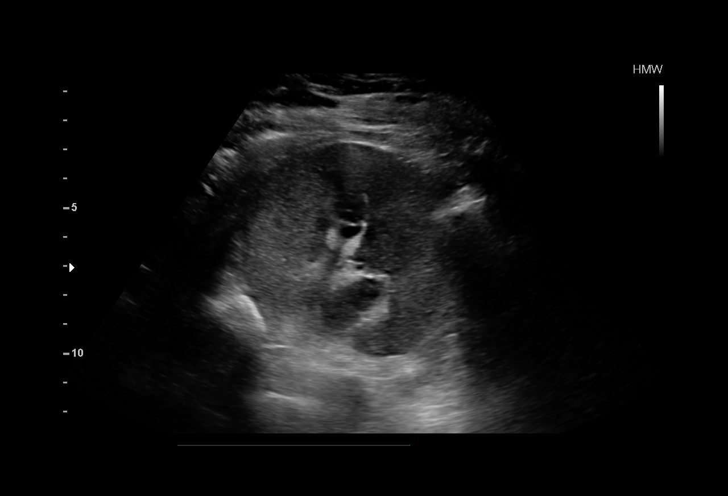
[im 20/39]
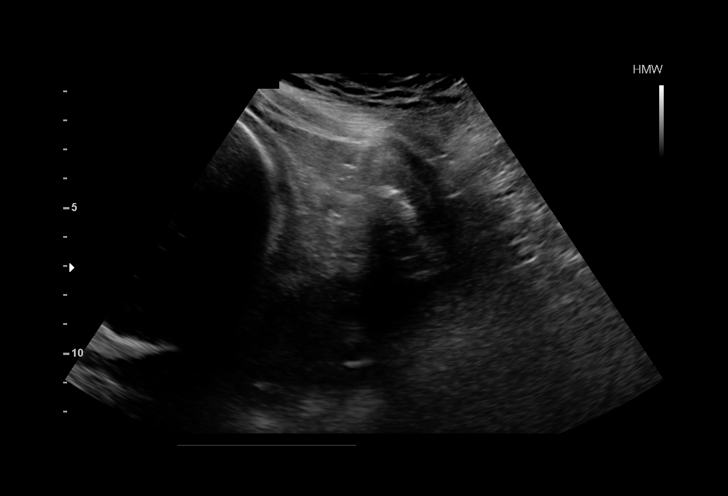
[im 23/39]
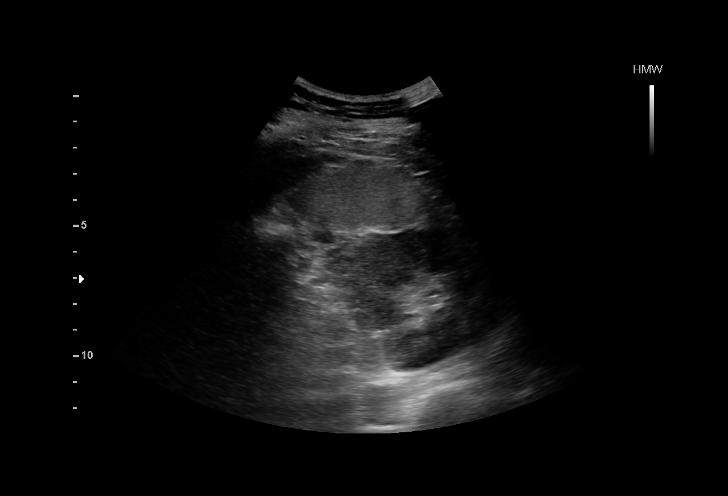
[im 24/39]
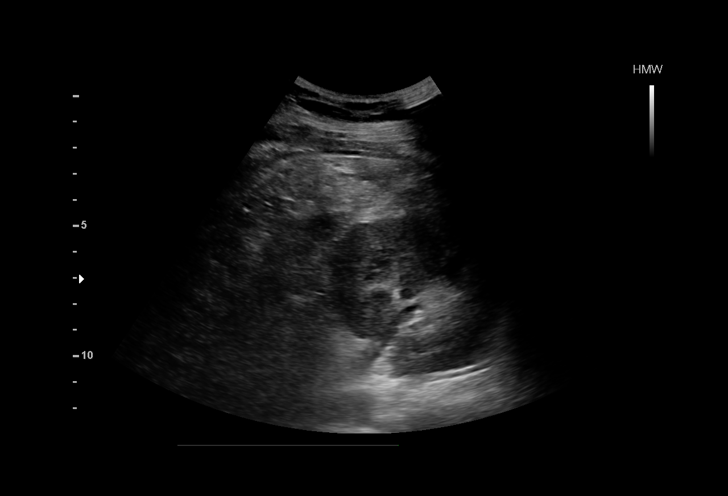
[im 27/39]
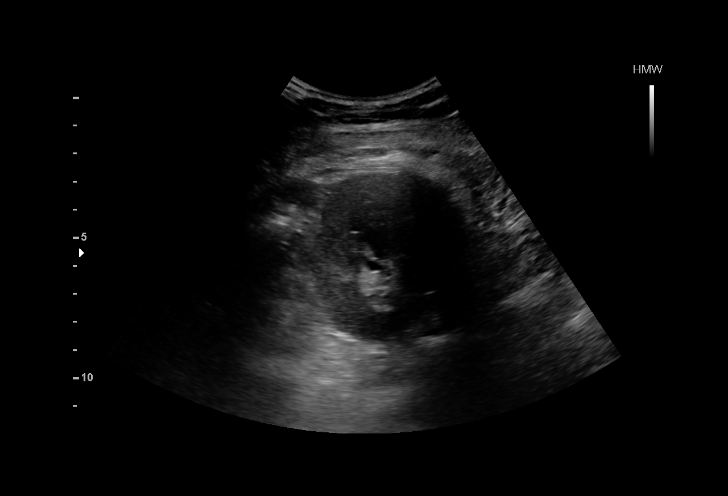
[im 31/39]
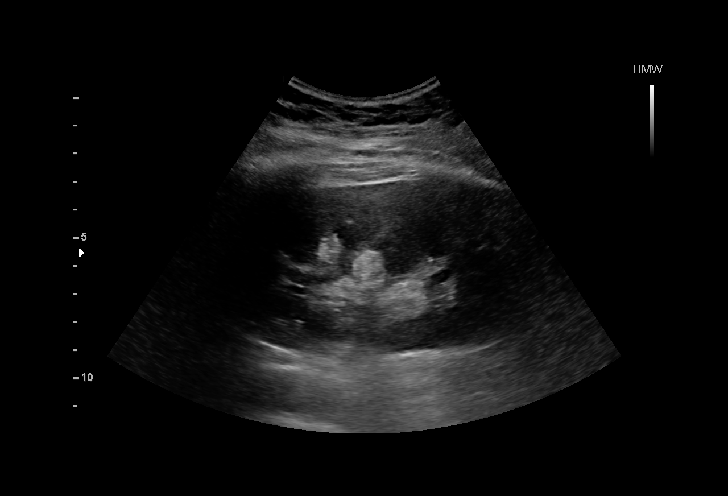
[im 32/39]
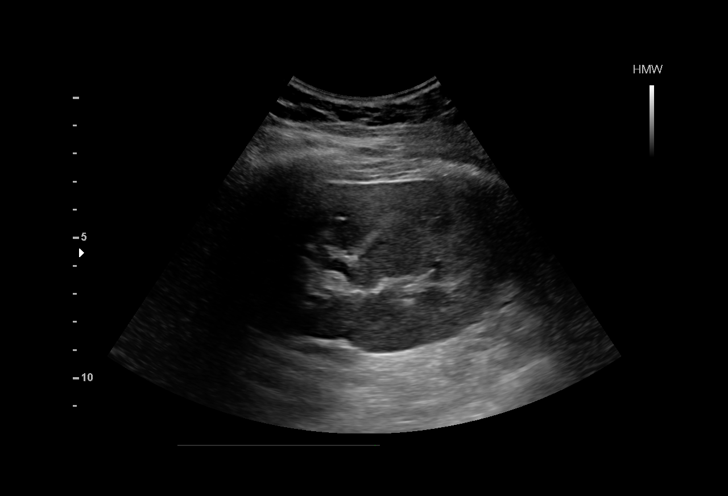
[im 35/39]
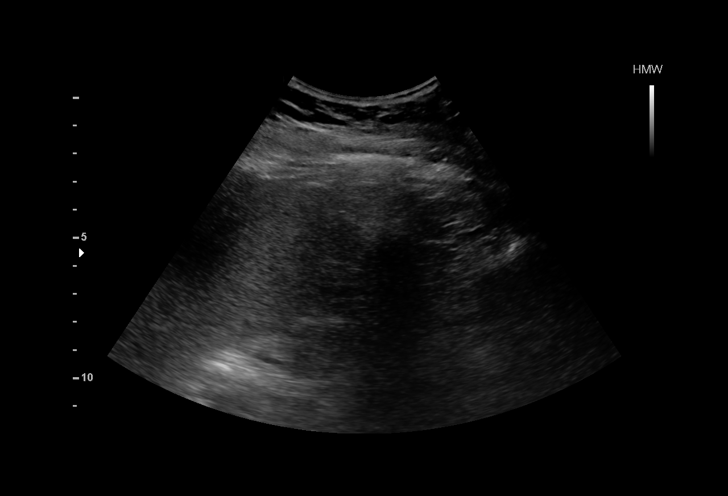
[im 39/39]
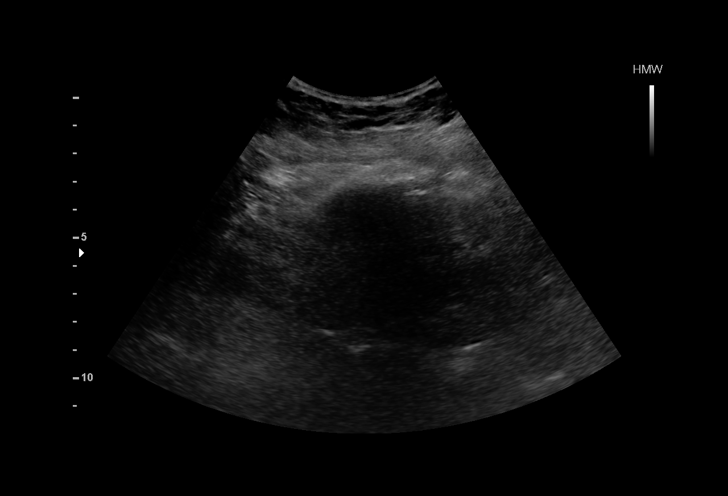

[15 of 25 positions shown; findings below may reference images not displayed]

FINDINGS: Right Kidney:

Length: 11 cm. Mild to moderate hydronephrosis without visualized
obstructive process. No sonographic evidence of nephrolithiasis. No
mass or fluid collection. Although hydronephrosis could be from
gravid uterus, given the hematuria and flank pain this is more
concerning for a ureteral calculus.

Left Kidney:

Length: 11 cm. Echogenicity within normal limits. No mass or
hydronephrosis visualized.

Bladder:

Completely decompressed and not evaluated. Unfortunately, this
precludes detection of ureteral jets.
IMPRESSION: Right hydronephrosis without visualized obstruction, as above.

## 2017-08-30 ENCOUNTER — Other Ambulatory Visit: Payer: Self-pay

## 2017-08-30 ENCOUNTER — Emergency Department (HOSPITAL_COMMUNITY)
Admission: EM | Admit: 2017-08-30 | Discharge: 2017-08-30 | Disposition: A | Discharge status: Home / Self Care | Payer: No Typology Code available for payment source | Attending: Emergency Medicine | Admitting: Emergency Medicine

## 2017-08-30 ENCOUNTER — Encounter (HOSPITAL_COMMUNITY): Payer: Self-pay | Admitting: *Deleted

## 2017-08-30 DIAGNOSIS — Z79899 Other long term (current) drug therapy: Secondary | ICD-10-CM | POA: Diagnosis not present

## 2017-08-30 DIAGNOSIS — M791 Myalgia, unspecified site: Secondary | ICD-10-CM | POA: Diagnosis not present

## 2017-08-30 DIAGNOSIS — Z041 Encounter for examination and observation following transport accident: Secondary | ICD-10-CM | POA: Diagnosis present

## 2017-08-30 DIAGNOSIS — M7918 Myalgia, other site: Secondary | ICD-10-CM

## 2017-08-30 MED ORDER — IBUPROFEN 600 MG PO TABS: 600.0000 mg | ORAL_TABLET | Freq: Four times a day (QID) | ORAL | 0 refills | Status: AC | PRN

## 2017-08-30 MED ORDER — METHOCARBAMOL 500 MG PO TABS: 500.0000 mg | ORAL_TABLET | Freq: Two times a day (BID) | ORAL | 0 refills | Status: DC
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
## 2017-08-30 NOTE — Discharge Instructions (Signed)
The pain your experiencing is likely due to muscle strain, you may take Ibuprofen and Robaxin as needed for pain management. Do not combine with any pain reliever other than tylenol. The muscle soreness should improve over the next week. Follow up with your family doctor in the next week for a recheck if you are still having symptoms. Return to ED if pain is worsening, you develop weakness or numbness of extremities, or new or concerning symptoms develop. °

## 2018-12-10 ENCOUNTER — Ambulatory Visit: Payer: BC Managed Care – PPO | Admitting: Professional

## 2020-05-22 NOTE — L&D Delivery Note (Signed)
Delivery Note At 9:44 AM a viable and healthy female was delivered via Vaginal, Spontaneous (Presentation: Left Occiput Anterior).  APGAR: 8, 9; weight pending .   Placenta status: Spontaneous, Intact.  Cord: 3 vessels with loose nuchal cord x 1  The patient had a protracted labor course due to intermittent fetal decelerations and periods where Pitocin required discontinuation.  She remained 9 cm for approximately 10 hours.  Finally she was found to be complete complete and +2.  Patient pushed for approximately 35 minutes and delivered a vigorous female infant in the vertex left occiput anterior presentation.  A loose nuchal cord was noted with delivery of the fetal head and reduced on the perineum.  The anterior and posterior shoulder delivered easily and the infant was passed to the waiting maternal abdomen.  Following a 1 minute delay, the cord was clamped and cut.  The placenta delivered spontaneously, intact, with three-vessel cord.  A left hymenal ring tear was repaired with 3-0 Vicryl.  A right hymenal ring tear that extended into a second-degree perineal laceration was also repaired with 3-0 Vicryl.  EBL of 707 was due to brisk bleeding from bilateral hymenal ring tears.  No specific pumping vessel could be identified.  All sponge, needle, instrument counts were correct.  Mom and baby are doing well following delivery.  Anesthesia: Epidural Episiotomy: None Lacerations: 2nd degree. Left hymenal ring tear Suture Repair: 3.0 vicryl Est. Blood Loss (mL):  707 cc  Mom to postpartum.  Baby to Couplet care / Skin to Skin.  Waynard Reeds 02/22/2021, 10:17 AM

## 2020-07-26 LAB — OB RESULTS CONSOLE ABO/RH: RH Type: NEGATIVE

## 2020-07-26 LAB — OB RESULTS CONSOLE GC/CHLAMYDIA
Chlamydia: NEGATIVE
Gonorrhea: NEGATIVE

## 2020-07-26 LAB — OB RESULTS CONSOLE ANTIBODY SCREEN: Antibody Screen: NEGATIVE

## 2020-07-26 LAB — OB RESULTS CONSOLE RPR: RPR: NONREACTIVE

## 2020-07-26 LAB — OB RESULTS CONSOLE HEPATITIS B SURFACE ANTIGEN: Hepatitis B Surface Ag: NEGATIVE

## 2020-07-26 LAB — OB RESULTS CONSOLE HIV ANTIBODY (ROUTINE TESTING): HIV: NONREACTIVE

## 2020-07-26 LAB — OB RESULTS CONSOLE RUBELLA ANTIBODY, IGM: Rubella: IMMUNE

## 2021-01-26 LAB — OB RESULTS CONSOLE GBS: GBS: NEGATIVE

## 2021-02-15 ENCOUNTER — Encounter (HOSPITAL_COMMUNITY): Payer: Self-pay | Admitting: *Deleted

## 2021-02-15 ENCOUNTER — Telehealth (HOSPITAL_COMMUNITY): Payer: Self-pay | Admitting: *Deleted

## 2021-02-15 ENCOUNTER — Encounter (HOSPITAL_COMMUNITY): Payer: Self-pay

## 2021-02-15 NOTE — Telephone Encounter (Signed)
Preadmission screen  

## 2021-02-21 ENCOUNTER — Inpatient Hospital Stay (HOSPITAL_COMMUNITY): Payer: BC Managed Care – PPO | Admitting: Anesthesiology

## 2021-02-21 ENCOUNTER — Encounter (HOSPITAL_COMMUNITY): Payer: Self-pay | Admitting: Obstetrics and Gynecology

## 2021-02-21 ENCOUNTER — Inpatient Hospital Stay (HOSPITAL_COMMUNITY)
Admission: AD | Admit: 2021-02-21 | Discharge: 2021-02-24 | DRG: 807 | Disposition: A | Discharge status: Home / Self Care | Payer: BC Managed Care – PPO | Attending: Obstetrics and Gynecology | Admitting: Obstetrics and Gynecology

## 2021-02-21 ENCOUNTER — Other Ambulatory Visit: Payer: Self-pay

## 2021-02-21 DIAGNOSIS — Z6791 Unspecified blood type, Rh negative: Secondary | ICD-10-CM | POA: Diagnosis not present

## 2021-02-21 DIAGNOSIS — O99214 Obesity complicating childbirth: Secondary | ICD-10-CM | POA: Diagnosis present

## 2021-02-21 DIAGNOSIS — Z20822 Contact with and (suspected) exposure to covid-19: Secondary | ICD-10-CM | POA: Diagnosis present

## 2021-02-21 DIAGNOSIS — Z3A39 39 weeks gestation of pregnancy: Secondary | ICD-10-CM | POA: Diagnosis not present

## 2021-02-21 DIAGNOSIS — O4292 Full-term premature rupture of membranes, unspecified as to length of time between rupture and onset of labor: Principal | ICD-10-CM | POA: Diagnosis present

## 2021-02-21 DIAGNOSIS — O329XX0 Maternal care for malpresentation of fetus, unspecified, not applicable or unspecified: Secondary | ICD-10-CM

## 2021-02-21 DIAGNOSIS — O26893 Other specified pregnancy related conditions, third trimester: Secondary | ICD-10-CM | POA: Diagnosis present

## 2021-02-21 DIAGNOSIS — Z3A00 Weeks of gestation of pregnancy not specified: Secondary | ICD-10-CM

## 2021-02-21 DIAGNOSIS — O34219 Maternal care for unspecified type scar from previous cesarean delivery: Secondary | ICD-10-CM | POA: Diagnosis present

## 2021-02-21 LAB — CBC
HCT: 35.8 % — ABNORMAL LOW (ref 36.0–46.0)
Hemoglobin: 12.2 g/dL (ref 12.0–15.0)
MCH: 30.4 pg (ref 26.0–34.0)
MCHC: 34.1 g/dL (ref 30.0–36.0)
MCV: 89.3 fL (ref 80.0–100.0)
Platelets: 199 10*3/uL (ref 150–400)
RBC: 4.01 MIL/uL (ref 3.87–5.11)
RDW: 12.9 % (ref 11.5–15.5)
WBC: 10.5 10*3/uL (ref 4.0–10.5)
nRBC: 0 % (ref 0.0–0.2)

## 2021-02-21 LAB — TYPE AND SCREEN
ABO/RH(D): A NEG
Antibody Screen: NEGATIVE

## 2021-02-21 LAB — RESP PANEL BY RT-PCR (FLU A&B, COVID) ARPGX2
Influenza A by PCR: NEGATIVE
Influenza B by PCR: NEGATIVE
SARS Coronavirus 2 by RT PCR: NEGATIVE

## 2021-02-21 LAB — POCT FERN TEST: POCT Fern Test: POSITIVE

## 2021-02-21 MED ORDER — FENTANYL-BUPIVACAINE-NACL 0.5-0.125-0.9 MG/250ML-% EP SOLN
EPIDURAL | Status: DC | PRN
  Administered 2021-02-21: 12 mL/h via EPIDURAL

## 2021-02-21 MED ORDER — LACTATED RINGERS IV SOLN
500.0000 mL | Freq: Once | INTRAVENOUS | Status: AC
  Administered 2021-02-21: 500 mL via INTRAVENOUS

## 2021-02-21 MED ORDER — PHENYLEPHRINE 40 MCG/ML (10ML) SYRINGE FOR IV PUSH (FOR BLOOD PRESSURE SUPPORT)
80.0000 ug | PREFILLED_SYRINGE | INTRAVENOUS | Status: AC | PRN
  Administered 2021-02-21 – 2021-02-22 (×2): 80 ug via INTRAVENOUS

## 2021-02-21 MED ORDER — EPHEDRINE 5 MG/ML INJ: 10.0000 mg | INTRAVENOUS | Status: DC | PRN

## 2021-02-21 MED ORDER — SOD CITRATE-CITRIC ACID 500-334 MG/5ML PO SOLN: 30.0000 mL | ORAL | Status: DC | PRN

## 2021-02-21 MED ORDER — OXYTOCIN-SODIUM CHLORIDE 30-0.9 UT/500ML-% IV SOLN
2.5000 [IU]/h | INTRAVENOUS | Status: DC
  Filled 2021-02-21: qty 500

## 2021-02-21 MED ORDER — OXYTOCIN-SODIUM CHLORIDE 30-0.9 UT/500ML-% IV SOLN
1.0000 m[IU]/min | INTRAVENOUS | Status: DC
Start: 2021-02-21 — End: 2021-02-22
  Administered 2021-02-21: 1 m[IU]/min via INTRAVENOUS

## 2021-02-21 MED ORDER — FENTANYL-BUPIVACAINE-NACL 0.5-0.125-0.9 MG/250ML-% EP SOLN
12.0000 mL/h | EPIDURAL | Status: DC | PRN
  Filled 2021-02-21: qty 250

## 2021-02-21 MED ORDER — DIPHENHYDRAMINE HCL 50 MG/ML IJ SOLN
12.5000 mg | INTRAMUSCULAR | Status: DC | PRN
Start: 2021-02-21 — End: 2021-02-22
  Administered 2021-02-22: 12.5 mg via INTRAVENOUS
  Filled 2021-02-21: qty 1

## 2021-02-21 MED ORDER — LACTATED RINGERS IV SOLN
500.0000 mL | INTRAVENOUS | Status: DC | PRN
  Administered 2021-02-21 – 2021-02-22 (×2): 500 mL via INTRAVENOUS

## 2021-02-21 MED ORDER — ONDANSETRON HCL 4 MG/2ML IJ SOLN: 4.0000 mg | Freq: Four times a day (QID) | INTRAMUSCULAR | Status: DC | PRN

## 2021-02-21 MED ORDER — PHENYLEPHRINE 40 MCG/ML (10ML) SYRINGE FOR IV PUSH (FOR BLOOD PRESSURE SUPPORT)
80.0000 ug | PREFILLED_SYRINGE | INTRAVENOUS | Status: DC | PRN
  Administered 2021-02-21: 80 ug via INTRAVENOUS
  Filled 2021-02-21: qty 10

## 2021-02-21 MED ORDER — LIDOCAINE HCL (PF) 1 % IJ SOLN
30.0000 mL | INTRAMUSCULAR | Status: DC | PRN
Start: 2021-02-21 — End: 2021-02-22

## 2021-02-21 MED ORDER — ACETAMINOPHEN 325 MG PO TABS: 650.0000 mg | ORAL_TABLET | ORAL | Status: DC | PRN

## 2021-02-21 MED ORDER — OXYTOCIN BOLUS FROM INFUSION
333.0000 mL | Freq: Once | INTRAVENOUS | Status: AC
  Administered 2021-02-22: 333 mL via INTRAVENOUS

## 2021-02-21 MED ORDER — LIDOCAINE-EPINEPHRINE (PF) 1.5 %-1:200000 IJ SOLN
INTRAMUSCULAR | Status: DC | PRN
  Administered 2021-02-21: 5 mL via PERINEURAL

## 2021-02-21 MED ORDER — FLEET ENEMA 7-19 GM/118ML RE ENEM: 1.0000 | ENEMA | RECTAL | Status: DC | PRN

## 2021-02-21 MED ORDER — TERBUTALINE SULFATE 1 MG/ML IJ SOLN: 0.2500 mg | Freq: Once | INTRAMUSCULAR | Status: DC | PRN

## 2021-02-21 MED ORDER — PHENYLEPHRINE 40 MCG/ML (10ML) SYRINGE FOR IV PUSH (FOR BLOOD PRESSURE SUPPORT)
PREFILLED_SYRINGE | INTRAVENOUS | Status: AC
  Administered 2021-02-21: 80 ug via INTRAVENOUS
  Filled 2021-02-21: qty 10

## 2021-02-21 MED ORDER — LACTATED RINGERS IV SOLN: INTRAVENOUS | Status: DC

## 2021-02-21 MED ORDER — EPHEDRINE 5 MG/ML INJ
10.0000 mg | INTRAVENOUS | Status: DC | PRN
  Filled 2021-02-21: qty 5

## 2021-02-21 NOTE — MAU Note (Signed)
Presents with c/o LOF since Saturday, states noticed more leaking last night.  Denies VB.  Endorses +FM

## 2021-02-21 NOTE — Anesthesia Preprocedure Evaluation (Signed)
Anesthesia Evaluation  ?Patient identified by MRN, date of birth, ID band ?Patient awake ? ? ? ?Reviewed: ?Allergy & Precautions, H&P , NPO status , Patient's Chart, lab work & pertinent test results ? ?History of Anesthesia Complications ?Negative for: history of anesthetic complications ? ?Airway ?Mallampati: II ? ?TM Distance: >3 FB ? ? ? ? Dental ?  ?Pulmonary ?neg pulmonary ROS,  ?  ?Pulmonary exam normal ? ? ? ? ? ? ? Cardiovascular ?negative cardio ROS ? ? ?Rhythm:regular Rate:Normal ? ? ?  ?Neuro/Psych ?negative neurological ROS ? negative psych ROS  ? GI/Hepatic ?negative GI ROS, Neg liver ROS,   ?Endo/Other  ?Morbid obesity ? Renal/GU ?  ? ?  ?Musculoskeletal ? ? Abdominal ?  ?Peds ? Hematology ?negative hematology ROS ?(+)   ?Anesthesia Other Findings ? ? Reproductive/Obstetrics ?(+) Pregnancy ? ?  ? ? ? ? ? ? ? ? ? ? ? ? ? ?  ?  ? ? ? ? ? ? ? ? ?Anesthesia Physical ?Anesthesia Plan ? ?ASA: 3 ? ?Anesthesia Plan: Epidural  ? ?Post-op Pain Management:   ? ?Induction:  ? ?PONV Risk Score and Plan:  ? ?Airway Management Planned:  ? ?Additional Equipment:  ? ?Intra-op Plan:  ? ?Post-operative Plan:  ? ?Informed Consent: I have reviewed the patients History and Physical, chart, labs and discussed the procedure including the risks, benefits and alternatives for the proposed anesthesia with the patient or authorized representative who has indicated his/her understanding and acceptance.  ? ? ? ? ? ?Plan Discussed with:  ? ?Anesthesia Plan Comments:   ? ? ? ? ? ? ?Anesthesia Quick Evaluation ? ?

## 2021-02-21 NOTE — Progress Notes (Signed)
OB Progress Note  S: comfortable   O: Today's Vitals   02/21/21 1500 02/21/21 1530 02/21/21 1600 02/21/21 1629  BP: 134/70 (!) 150/67 115/73   Pulse: 94 97 100   Resp:      Temp:   99.1 F (37.3 C)   TempSrc:   Axillary   SpO2:      Weight:      Height:      PainSc: 0-No pain  0-No pain 0-No pain   Body mass index is 65.06 kg/m.  SVE 1.5/80/-2, IUPC placed  FHR:  130bpm, mod variability, + accels, no decels Toco: difficult to assess with external toco, IUPC now in place   A/P: 24Y G2P1001 @ [redacted]w[redacted]d, PROM Fetal wellbeing: cat I tracing PROM: unclear if Saturday around 2240 (at this point ~36 hours) or this AM around 0300. No signs of infection, will monitor closely TOLAC: labor augmentation with pitocin, currently at 2mu/min, will be able to titrate better with IUPC in place Rh neg: will check neonatal ABORH after delivery Morbid obesity: EFW 67% Pain control: anesthesia placed epidural catheter, epidural dosing on patient request.   Alinda Deem, MD 02/21/21 4:44 PM

## 2021-02-21 NOTE — Progress Notes (Signed)
OB Progress Note  S:Still feeling some pressure after epidural dosing   O: Today's Vitals   02/21/21 2256 02/21/21 2301 02/21/21 2306 02/21/21 2311  BP: (!) 114/96 132/78 98/78 (!) 117/59  Pulse: 85 78 87 83  Resp:      Temp:      TempSrc:      SpO2:      Weight:      Height:      PainSc:       Body mass index is 65.06 kg/m.  SVE 8/100/-1, AROM forebag thin meconium, FSE placed  FHR: 130bpm, moderate variability, + accels, no decels Toco: ctx q 3-5 min  A/P: 24Y G2P1001 @ [redacted]w[redacted]d, PROM Fetal wellbeing: cat I tracing PROM: unclear if Saturday around 2240 (at this point ~48 hours) or this AM around 0300. No signs of infection, will monitor closely TOLAC: pitocin had been turned off while awaiting epidural dosing. Now s/p AROM forebag, if contractions do not increase will restart pitocin and titrate per protocol. IUPC in place.   Rh neg: will check neonatal ABORH after delivery Morbid obesity: EFW 67% Pain control: epidural   M. Timothy Lasso, MD 02/21/21 11:49 PM

## 2021-02-21 NOTE — Anesthesia Procedure Notes (Signed)
Epidural Patient location during procedure: OB Start time: 02/21/2021 1:29 PM End time: 02/21/2021 1:40 PM  Staffing Anesthesiologist: Lucretia Kern, MD Performed: anesthesiologist   Preanesthetic Checklist Completed: patient identified, IV checked, risks and benefits discussed, monitors and equipment checked, pre-op evaluation and timeout performed  Epidural Patient position: sitting Prep: DuraPrep Patient monitoring: heart rate, continuous pulse ox and blood pressure Approach: midline Location: L3-L4 Injection technique: LOR air  Needle:  Needle type: Tuohy  Needle gauge: 17 G Needle length: 9 cm Needle insertion depth: 9 cm Catheter type: closed end flexible Catheter size: 19 Gauge Catheter at skin depth: 15 cm Test dose: negative and 1.5% lidocaine with Epi 1:200 K  Assessment Events: blood not aspirated, injection not painful, no injection resistance, no paresthesia and negative IV test  Additional Notes Single pass of 9cm Tuohy, LOR at 9 with significant indentation of skinReason for block:procedure for pain

## 2021-02-21 NOTE — H&P (Signed)
Tammy Mcclain is a 25 y.o. female G2P1001 [redacted]w[redacted]d presenting for leakage of fluid. States she has been leaking fluid since Saturday and then leaking increased this morning around 0300. Occasional mild contractions. NO VB. Good FM. No fevers/chills.   In MAU, ROM confirmed.   Pregnancy c/b: History of cesarean for NRFHT: in 2016, elective induction, got to 4cm. 2 layer uterine closure.  Morbid obesity: prepreg BMI 66. Early and routine 1HR GTT normal. 9/22 growth scan:  7lbs 11oz, 67%ile Rh neg: received Rhogam on 12/02/20  OB History     Gravida  2   Para  1   Term  1   Preterm      AB      Living  1      SAB      IAB      Ectopic      Multiple  0   Live Births  1          Past Medical History:  Diagnosis Date   Kidney stones 05/22/2014   Past Surgical History:  Procedure Laterality Date   CESAREAN SECTION N/A 04/22/2015   Procedure: CESAREAN SECTION;  Surgeon: Lavina Hamman, MD;  Location: [facility];  Service: Obstetrics;  Laterality: N/A;   Family History: family history is not on file. Social History:  reports that she has never smoked. She has never used smokeless tobacco. She reports that she does not drink alcohol and does not use drugs.     Maternal Diabetes: No Genetic Screening: Declined Maternal Ultrasounds/Referrals: Normal Fetal Ultrasounds or other Referrals:  Other:  growth scan as noted above Maternal Substance Abuse:  No Significant Maternal Medications:  None Significant Maternal Lab Results:  Group B Strep negative and Rh negative Other Comments:  None  Review of Systems Per HPI Exam Physical Exam    Blood pressure 119/76, pulse 99, temperature 98.3 F (36.8 C), temperature source Oral, resp. rate 19, height 5\' 2"  (1.575 m), weight (!) 161.3 kg, SpO2 97 %, unknown if currently breastfeeding. Gen: NAD, resting comfortably CVS: RRR Lungs: CTAB Abd: Gravid abdomen, morbid obesity Ext: no calf edema or tenderness  Fetal testing:  135bpm, moderate variability, + accels, no decels Toco: irregular ctx Prenatal labs: ABO, Rh:  --/--/A NEG (10/03 1027) Antibody: NEG (10/03 1027) Rubella: Immune (03/07 0000) RPR: Nonreactive (03/07 0000)  HBsAg: Negative (03/07 0000)  HIV: Non-reactive (03/07 0000)  GBS: Negative/-- (09/07 0000)   Assessment/Plan: 24Y G2P1001 @ [redacted]w[redacted]d, PROM Fetal wellbeing: cat I tracing PROM: unclear if Saturday around 2240 (at this point ~36 hours) or this AM around 0300. No signs of infection, will monitor closely. Plan to start low dose pitocin for ripening.  TOLAC: long discussion with patient regarding TOLAC vs. Repeat cesarean. TOLAC calculator 14.5% chance of success. Patient's desire for TOLAC driven primarily by desire for future pregnancies and wish to avoid multiple cesareans. We discussed risk of uterine rupture, and risk increases with augmentation with pitocin compared with spontaneous labor. She understands risk of emergent cesarean, complicated by her obesity and prior cesarean. She wishes to proceed with TOLAC. We discussed low threshold for repeat cesarean if any sign of fetal distress. We discussed plan to place epidural catheter before starting pitocin and can request epidural dose whenever she wishes.  Rh neg: will check neonatal ABORH after delivery Morbid obesity: EFW 67% Pain control: anesthesia notified for dry epidural catheter, epidural dosing on patient request.   Saturday, MD 02/21/21 1:29 PM

## 2021-02-21 NOTE — Progress Notes (Signed)
Pt informed that the ultrasound is considered a limited OB ultrasound and is not intended to be a complete ultrasound exam.  Patient also informed that the ultrasound is not being completed with the intent of assessing for fetal or placental anomalies or any pelvic abnormalities.  Explained that the purpose of today's ultrasound is to assess for  presentation.  Patient acknowledges the purpose of the exam and the limitations of the study.    Cephalic  Jaxiel Kines, NP   

## 2021-02-22 ENCOUNTER — Encounter (HOSPITAL_COMMUNITY): Payer: Self-pay | Admitting: Obstetrics and Gynecology

## 2021-02-22 DIAGNOSIS — O34219 Maternal care for unspecified type scar from previous cesarean delivery: Secondary | ICD-10-CM | POA: Diagnosis present

## 2021-02-22 LAB — RPR: RPR Ser Ql: NONREACTIVE

## 2021-02-22 LAB — CBC
HCT: 30.4 % — ABNORMAL LOW (ref 36.0–46.0)
Hemoglobin: 10.4 g/dL — ABNORMAL LOW (ref 12.0–15.0)
MCH: 30.5 pg (ref 26.0–34.0)
MCHC: 34.2 g/dL (ref 30.0–36.0)
MCV: 89.1 fL (ref 80.0–100.0)
Platelets: 182 10*3/uL (ref 150–400)
RBC: 3.41 MIL/uL — ABNORMAL LOW (ref 3.87–5.11)
RDW: 13.1 % (ref 11.5–15.5)
WBC: 14.7 10*3/uL — ABNORMAL HIGH (ref 4.0–10.5)
nRBC: 0 % (ref 0.0–0.2)

## 2021-02-22 LAB — CREATININE, SERUM
Creatinine, Ser: 0.67 mg/dL (ref 0.44–1.00)
GFR, Estimated: >60 mL/min (ref >60)

## 2021-02-22 MED ORDER — ONDANSETRON HCL 4 MG/2ML IJ SOLN: 4.0000 mg | INTRAMUSCULAR | Status: DC | PRN

## 2021-02-22 MED ORDER — PRENATAL MULTIVITAMIN CH
1.0000 | ORAL_TABLET | Freq: Every day | ORAL | Status: DC
  Administered 2021-02-23: 1 via ORAL
  Filled 2021-02-22: qty 1

## 2021-02-22 MED ORDER — OXYCODONE HCL 5 MG PO TABS: 10.0000 mg | ORAL_TABLET | ORAL | Status: DC | PRN

## 2021-02-22 MED ORDER — FENTANYL CITRATE (PF) 100 MCG/2ML IJ SOLN
100.0000 ug | Freq: Once | INTRAMUSCULAR | Status: AC
  Administered 2021-02-22: 100 ug via INTRAVENOUS

## 2021-02-22 MED ORDER — WITCH HAZEL-GLYCERIN EX PADS: 1.0000 "application " | MEDICATED_PAD | CUTANEOUS | Status: DC | PRN

## 2021-02-22 MED ORDER — COCONUT OIL OIL: 1.0000 "application " | TOPICAL_OIL | Status: DC | PRN

## 2021-02-22 MED ORDER — BENZOCAINE-MENTHOL 20-0.5 % EX AERO
1.0000 "application " | INHALATION_SPRAY | CUTANEOUS | Status: DC | PRN
  Administered 2021-02-22: 1 via TOPICAL
  Filled 2021-02-22: qty 56

## 2021-02-22 MED ORDER — DIBUCAINE (PERIANAL) 1 % EX OINT: 1.0000 "application " | TOPICAL_OINTMENT | CUTANEOUS | Status: DC | PRN

## 2021-02-22 MED ORDER — ACETAMINOPHEN 325 MG PO TABS
650.0000 mg | ORAL_TABLET | ORAL | Status: DC | PRN
  Administered 2021-02-22 – 2021-02-24 (×4): 650 mg via ORAL
  Filled 2021-02-22 (×4): qty 2

## 2021-02-22 MED ORDER — TETANUS-DIPHTH-ACELL PERTUSSIS 5-2.5-18.5 LF-MCG/0.5 IM SUSY: 0.5000 mL | PREFILLED_SYRINGE | Freq: Once | INTRAMUSCULAR | Status: DC

## 2021-02-22 MED ORDER — EPINEPHRINE PF 1 MG/ML IJ SOLN
INTRAMUSCULAR | Status: AC
  Filled 2021-02-22: qty 1

## 2021-02-22 MED ORDER — FENTANYL CITRATE (PF) 100 MCG/2ML IJ SOLN
INTRAMUSCULAR | Status: DC | PRN
  Administered 2021-02-22: 100 ug via EPIDURAL

## 2021-02-22 MED ORDER — LIDOCAINE HCL (PF) 2 % IJ SOLN
INTRAMUSCULAR | Status: AC
  Filled 2021-02-22: qty 10

## 2021-02-22 MED ORDER — FENTANYL CITRATE (PF) 100 MCG/2ML IJ SOLN
INTRAMUSCULAR | Status: AC
  Filled 2021-02-22: qty 2

## 2021-02-22 MED ORDER — ZOLPIDEM TARTRATE 5 MG PO TABS: 5.0000 mg | ORAL_TABLET | Freq: Every evening | ORAL | Status: DC | PRN

## 2021-02-22 MED ORDER — ONDANSETRON HCL 4 MG PO TABS: 4.0000 mg | ORAL_TABLET | ORAL | Status: DC | PRN

## 2021-02-22 MED ORDER — SIMETHICONE 80 MG PO CHEW: 80.0000 mg | CHEWABLE_TABLET | ORAL | Status: DC | PRN

## 2021-02-22 MED ORDER — ENOXAPARIN SODIUM 80 MG/0.8ML IJ SOSY
80.0000 mg | PREFILLED_SYRINGE | INTRAMUSCULAR | Status: DC
  Administered 2021-02-22 – 2021-02-23 (×2): 80 mg via SUBCUTANEOUS
  Filled 2021-02-22 (×2): qty 0.8

## 2021-02-22 MED ORDER — BUPIVACAINE HCL (PF) 0.25 % IJ SOLN
INTRAMUSCULAR | Status: DC | PRN
  Administered 2021-02-22: 8 mL via EPIDURAL

## 2021-02-22 MED ORDER — DIPHENHYDRAMINE HCL 25 MG PO CAPS: 25.0000 mg | ORAL_CAPSULE | Freq: Four times a day (QID) | ORAL | Status: DC | PRN

## 2021-02-22 MED ORDER — IBUPROFEN 600 MG PO TABS
600.0000 mg | ORAL_TABLET | Freq: Four times a day (QID) | ORAL | Status: DC
  Administered 2021-02-22 – 2021-02-24 (×8): 600 mg via ORAL
  Filled 2021-02-22 (×8): qty 1

## 2021-02-22 MED ORDER — OXYCODONE HCL 5 MG PO TABS: 5.0000 mg | ORAL_TABLET | ORAL | Status: DC | PRN

## 2021-02-22 MED ORDER — SENNOSIDES-DOCUSATE SODIUM 8.6-50 MG PO TABS
2.0000 | ORAL_TABLET | Freq: Every day | ORAL | Status: DC
  Administered 2021-02-23: 2 via ORAL
  Filled 2021-02-22: qty 2

## 2021-02-22 NOTE — Anesthesia Postprocedure Evaluation (Signed)
Anesthesia Post Note  Patient: [name]  Procedure(s) Performed: AN AD HOC LABOR EPIDURAL     Patient location during evaluation: Mother Baby Anesthesia Type: Epidural Level of consciousness: awake and alert and oriented Pain management: satisfactory to patient Vital Signs Assessment: post-procedure vital signs reviewed and stable Respiratory status: respiratory function stable Cardiovascular status: stable Postop Assessment: no headache, no backache, epidural receding, patient able to bend at knees, no signs of nausea or vomiting and adequate PO intake Anesthetic complications: no   No notable events documented.  Last Vitals:  Vitals:   02/22/21 1152 02/22/21 1304  BP: 128/65 (!) 119/56  Pulse: (!) 115 94  Resp: 18 18  Temp: 37.4 C 37.2 C  SpO2:  99%    Last Pain:  Vitals:   02/22/21 1547  TempSrc:   PainSc: 3    Pain Goal: Patients Stated Pain Goal: 0 (02/21/21 1852)                 Karleen Dolphin

## 2021-02-22 NOTE — Progress Notes (Signed)
Pitocin off for several hours due to patient discomfort. OB Anesthesiologist was able to place a new CSE, with subsequent 5 min decel with BP drop that resolved with phenylephrine.  Patient now comfortable, FSE in place and IUPC readjusted and now properly tracing contractions, q 7 mins currently. Pitocin restarted, titrate per protocol.   Fetal status reassuring with moderate variability, accels, and early decels.   Alinda Deem, MD 02/22/21 4:29 AM

## 2021-02-22 NOTE — Progress Notes (Addendum)
Patient told not to get up without RN until further notice. Patient told the plan of care and call bell explained. Dr. Tenny Craw called and verified it was ok for patient to have oxycodone due to the allergy is just itching.

## 2021-02-22 NOTE — Progress Notes (Signed)
Patient ID: METTIE ROYLANCE, female   DOB: [birth year], 25 y.o.   MRN: [id number]  S: Patient comfortable with epidural.  Is aware of contractions, but does not feel pain. O:  Vitals:   02/22/21 0701 02/22/21 0730 02/22/21 0800 02/22/21 0830  BP: 128/66 122/64 126/62 135/68  Pulse: 84 92 90 96  Resp: 18 18 18 18   Temp: 98.5 F (36.9 C)     TempSrc: Oral     SpO2:      Weight:      Height:       Alert and oriented x3, no apparent distress Fetal heart rate 1 30-1 40, moderate variability.  With vaginal exam fetal scalp electrode became disengaged.  FSE replaced.  Following replacement fetal heart rate was in the 180s and that had a deceleration to 90s for 1 to 2 minutes.  Patient was repositioned and fetal heart rate now back to baseline. Cervix complete/complete/+2 Toco every 2 to 3  Assessment and plan: 25 year old G2, P1 at 40+0 attempting TOLAC 1) patient was 9 cm for approximately 10 hours.  Pitocin has been on and off due to fetal decelerations.  Patient is now complete.  Discussed with patient waiting 20 to 30 minutes after most recent deceleration and then will proceed with attempts at pushing.  Patient understands may still need to proceed with cesarean delivery if fetus poorly tolerant to pushing efforts. 2) overall fetal wellbeing reassuring.

## 2021-02-23 LAB — CBC
HCT: 28.6 % — ABNORMAL LOW (ref 36.0–46.0)
Hemoglobin: 9.5 g/dL — ABNORMAL LOW (ref 12.0–15.0)
MCH: 30 pg (ref 26.0–34.0)
MCHC: 33.2 g/dL (ref 30.0–36.0)
MCV: 90.2 fL (ref 80.0–100.0)
Platelets: 182 10*3/uL (ref 150–400)
RBC: 3.17 MIL/uL — ABNORMAL LOW (ref 3.87–5.11)
RDW: 13.2 % (ref 11.5–15.5)
WBC: 10.1 10*3/uL (ref 4.0–10.5)
nRBC: 0 % (ref 0.0–0.2)

## 2021-02-23 MED ORDER — RHO D IMMUNE GLOBULIN 1500 UNIT/2ML IJ SOSY
300.0000 ug | PREFILLED_SYRINGE | Freq: Once | INTRAMUSCULAR | Status: AC
  Administered 2021-02-23: 300 ug via INTRAVENOUS
  Filled 2021-02-23: qty 2

## 2021-02-23 NOTE — Progress Notes (Signed)
POSTPARTUM PROGRESS NOTE  Post Partum Day #1  Subjective:  No acute events overnight.  Pt denies problems with ambulating, voiding or po intake.  She denies nausea or vomiting.  Pain is well controlled.  Lochia Minimal.   Objective: Blood pressure 127/78, pulse 82, temperature 97.7 F (36.5 C), temperature source Oral, resp. rate 18, height 5\' 2"  (1.575 m), weight (!) 161.3 kg, SpO2 99 %, unknown if currently breastfeeding.  Physical Exam:  General: alert, cooperative and no distress Lochia:normal flow Chest: CTAB Heart: RRR no m/r/g Abdomen: +BS, soft, nontender Uterine Fundus: firm, below umbilicus GU: suture intact, healing well, no purulent drainage Extremities: neg edema, neg calf TTP BL, neg Homans BL  Recent Labs    02/22/21 1210 02/23/21 0535  HGB 10.4* 9.5*  HCT 30.4* 28.6*    Assessment/Plan:  ASSESSMENT: Tammy Mcclain is a 25 y.o. 30 s/p VBAC @ [redacted]w[redacted]d. PNC c/b H/o csx x1, BMI 66, Rh neg.   Plan for discharge tomorrow and Breastfeeding   LOS: 2 days

## 2021-02-23 NOTE — Social Work (Addendum)
CSW received consult for hx of Anxiety.  CSW met with MOB to offer support and complete assessment.    CSW met with MOB at bedside and introduced CSW role. CSW congratulated MOB and FOB. CSW observed MOB lying in bed, infant asleep in bassinet and FOB lying on couch. MOB presented pleasant calm and receptive to [place] visit. CSW inquired how MOB has felt since giving birth. MOB expressed feeling good and shared her L&D went a lot better this time due to having a vaginal delivery instead of c-section. CSW inquired about MOB history of anxiety. MOB disclosed she has anxiety and social anxiety which is triggered by being in a large group of people and stress. MOB shared during the pregnancy she experienced a panic attack in February due to stress at work. She reported having a possible panic attack four months ago, but the physician informed her it could have been the infant's positioning. MOB shared she does not take medication and had one therapy session which was not beneficial. MOB shared she uses her coping skills to stay calm. MOB gave example of her sitting down and relaxing until the anxiety passes. MOB shared she experienced postpartum depression/anxiety after the birth of her older child about five years ago. MOB reported difficulty with breastfeeding which impacted her mental health. She felt resentful and did not bond well with her son. MOB stated when she stopped breastfeeding after about six weeks she felt better. MOB reported she also informed her doctor about her concerns and was recommended therapy at time. CSW provided education regarding the baby blues period vs. perinatal mood disorders, discussed treatment and gave resources for mental health follow up if concerns arise.  CSW recommended MOB complete a self-evaluation during the postpartum time period using the New Mom Checklist from Postpartum Progress and encouraged MOB to contact a medical professional if symptoms are noted at any time.  MOB  shared she feels comfortable reaching out to her physician if concerns arise. CSW assessed MOB for safety. MOB denied thoughts of harm to self and other. MOB identified her spouse and family as supports.   CSW provided review of Sudden Infant Death Syndrome (SIDS) precautions. MOB reported she has essential items for the infant including a car seat and a bassinet where the infant will sleep. MOB has chosen [facility] for the infants follow up care. CSW assessed MOB for additional needs. MOB reported no further need.   CSW identifies no further need for intervention and no barriers to discharge at this time.   Kathrin Greathouse, MSW, LCSW Women's and May Worker  [phone] 02/23/2021  12:21 PM

## 2021-02-24 LAB — RH IG WORKUP (INCLUDES ABO/RH)
Fetal Screen: NEGATIVE
Gestational Age(Wks): 40
Unit division: 0

## 2021-02-24 NOTE — Progress Notes (Signed)
Patient is eating, ambulating, voiding.  Pain control is good.  Vitals:   02/22/21 2331 02/23/21 0510 02/23/21 1956 02/24/21 0514  BP: (!) 127/47 127/78 129/67 116/61  Pulse: 94 82 88 89  Resp: 18 18 17 18   Temp: 97.9 F (36.6 C) 97.7 F (36.5 C) 98.1 F (36.7 C) 98.5 F (36.9 C)  TempSrc: Oral Oral Oral Oral  SpO2: 100% 99% 100% 100%  Weight:      Height:        Fundus firm Perineum without swelling.  Lab Results  Component Value Date   WBC 10.1 02/23/2021   HGB 9.5 (L) 02/23/2021   HCT 28.6 (L) 02/23/2021   MCV 90.2 02/23/2021   PLT 182 02/23/2021    --/--/A NEG (10/03 1027)/RI  A/P Post partum day 2.  Routine care.  Expect d/c today.  S/p Rhogam.   07-16-1989

## 2021-02-24 NOTE — Discharge Summary (Signed)
Postpartum Discharge Summary  Date of Service updated      Patient Name: Tammy Mcclain DOB: 1995/10/04 MRN: [id number]  Date of admission: 02/21/2021 Delivery date:02/22/2021  Delivering provider: Vanessa Kick  Date of discharge: 02/24/2021  Admitting diagnosis: Normal labor [O80, Z37.9] Vaginal birth after cesarean [O34.219] Intrauterine pregnancy: [redacted]w[redacted]d    Secondary diagnosis:  Active Problems:   Normal labor   Vaginal birth after cesarean  Additional problems: VBAC    Discharge diagnosis: Term Pregnancy Delivered                                              Post partum procedures:rhogam Augmentation: Pitocin Complications: None  Hospital course: Onset of Labor With Vaginal Delivery      25y.o. yo [id number] admitted in Latent Labor on 02/21/2021. Patient had an uncomplicated labor course as follows:  Membrane Rupture Time/Date: 10:40 PM ,02/19/2021   Delivery Method:VBAC, Spontaneous  Episiotomy: None  Lacerations:  2nd degree  Patient had an uncomplicated postpartum course.  She is ambulating, tolerating a regular diet, passing flatus, and urinating well. Patient is discharged home in stable condition on 02/24/21.  Newborn Data: Birth date:02/22/2021  Birth time:9:44 AM  Gender:Female  Living status:Living  Apgars:8 ,9  Weight:3425 g   Magnesium Sulfate received: No BMZ received: No Rhophylac:Yes MMR:No T-DaP:Given prenatally Flu: No Transfusion:No  Physical exam  Vitals:   02/22/21 2331 02/23/21 0510 02/23/21 1956 02/24/21 0514  BP: (!) 127/47 127/78 129/67 116/61  Pulse: 94 82 88 89  Resp: _0 Temp: 97.9 F (36.6 C) 97.7 F (36.5 C) 98.1 F (36.7 C) 98.5 F (36.9 C)  TempSrc: Oral Oral Oral Oral  SpO2: 100% 99% 100% 100%  Weight:      Height:        Labs: Lab Results  Component Value Date   WBC 10.1 02/23/2021   HGB 9.5 (L) 02/23/2021   HCT 28.6 (L) 02/23/2021   MCV 90.2 02/23/2021   PLT 182 02/23/2021   CMP  Latest Ref Rng & Units 02/22/2021  Glucose 65 - 99 mg/dL -  BUN 6 - 20 mg/dL -  Creatinine 0.44 - 1.00 mg/dL 0.67  Sodium 135 - 145 mmol/L -  Potassium 3.5 - 5.1 mmol/L -  Chloride 101 - 111 mmol/L -  CO2 22 - 32 mmol/L -  Calcium 8.9 - 10.3 mg/dL -  Total Protein 6.5 - 8.1 g/dL -  Total Bilirubin 0.3 - 1.2 mg/dL -  Alkaline Phos 38 - 126 U/L -  AST 15 - 41 U/L -  ALT 14 - 54 U/L -   Edinburgh Score: Edinburgh Postnatal Depression Scale Screening Tool 02/22/2021  I have been able to laugh and see the funny side of things. 0  I have looked forward with enjoyment to things. 0  I have blamed myself unnecessarily when things went wrong. 1  I have been anxious or worried for no good reason. 2  I have felt scared or panicky for no good reason. 0  Things have been getting on top of me. 0  I have been so unhappy that I have had difficulty sleeping. 0  I have felt sad or miserable. 0  I have been so unhappy that I have been crying. 0  The thought of harming myself has occurred to  me. 0  Edinburgh Postnatal Depression Scale Total 3      After visit meds:  Allergies as of 02/24/2021       Reactions   Oxycodone Itching        Medication List     STOP taking these medications    HYDROmorphone 2 MG tablet Commonly known as: DILAUDID   methocarbamol 500 MG tablet Commonly known as: ROBAXIN       TAKE these medications    ibuprofen 600 MG tablet Commonly known as: ADVIL Take 1 tablet (600 mg total) by mouth every 6 (six) hours as needed.   prenatal multivitamin Tabs tablet Take 1 tablet by mouth daily at 12 noon.               Discharge Care Instructions  (From admission, onward)           Start     Ordered   02/24/21 0000  Discharge wound care:       Comments: Sitz baths and icepacks to perineum.  If stitches, they will dissolve.   02/24/21 0701             Discharge home in stable condition Infant Feeding: Bottle Infant Disposition:home with  mother Discharge instruction: per After Visit Summary and Postpartum booklet. Activity: Advance as tolerated. Pelvic rest for 6 weeks.  Diet: routine diet Anticipated Birth Control: Unsure Postpartum Appointment:4 weeks Additional Postpartum F/U:  none Future Appointments:No future appointments. Follow up Visit:  [place] Follow up in 4 week(s).   Contact information: [address] [place] [address] [phone]                     02/24/2021 Daria Pastures, MD

## 2021-02-25 ENCOUNTER — Inpatient Hospital Stay (HOSPITAL_COMMUNITY): Payer: BC Managed Care – PPO

## 2021-02-25 ENCOUNTER — Inpatient Hospital Stay (HOSPITAL_COMMUNITY)
Admission: AD | Admit: 2021-02-25 | Payer: BC Managed Care – PPO | Source: Home / Self Care | Admitting: Obstetrics and Gynecology

## 2021-02-28 ENCOUNTER — Encounter (HOSPITAL_COMMUNITY): Admission: RE | Admit: 2021-02-28 | Payer: BC Managed Care – PPO | Source: Ambulatory Visit

## 2021-03-01 ENCOUNTER — Inpatient Hospital Stay (HOSPITAL_COMMUNITY)
Admission: AD | Admit: 2021-03-01 | Payer: BC Managed Care – PPO | Source: Home / Self Care | Admitting: Obstetrics and Gynecology

## 2021-03-01 SURGERY — Surgical Case
Anesthesia: Spinal

## 2021-03-05 ENCOUNTER — Telehealth (HOSPITAL_COMMUNITY): Payer: Self-pay

## 2021-03-05 NOTE — Telephone Encounter (Signed)
"  I'm doing good. Feeling about 95%. I'm feeling good." Patient declines questions or concerns about her healing.  "She's good. She is gaining some weight. She's a messy eater. She is doing good, no issues. She sleeps in a bassinet. "RN reviewed ABC's of safe sleep with patient. Patient declines any questions or concerns about baby.  EPDS score is 7.  Marcelino Duster [facility] 03/05/2021,1036

## 2022-02-04 ENCOUNTER — Encounter: Payer: Self-pay | Admitting: Emergency Medicine

## 2022-02-04 ENCOUNTER — Ambulatory Visit
Admission: EM | Admit: 2022-02-04 | Discharge: 2022-02-04 | Disposition: A | Discharge status: Home / Self Care | Payer: BC Managed Care – PPO | Attending: Physician Assistant | Admitting: Physician Assistant

## 2022-02-04 DIAGNOSIS — T148XXA Other injury of unspecified body region, initial encounter: Secondary | ICD-10-CM | POA: Diagnosis not present

## 2022-02-04 DIAGNOSIS — M545 Low back pain, unspecified: Secondary | ICD-10-CM | POA: Diagnosis not present

## 2022-02-04 MED ORDER — PREDNISONE 20 MG PO TABS: 40.0000 mg | ORAL_TABLET | Freq: Every day | ORAL | 0 refills | Status: AC

## 2022-02-04 MED ORDER — CYCLOBENZAPRINE HCL 10 MG PO TABS: 10.0000 mg | ORAL_TABLET | Freq: Two times a day (BID) | ORAL | 0 refills | Status: AC | PRN
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
## 2023-06-20 ENCOUNTER — Ambulatory Visit: Payer: No Typology Code available for payment source | Admitting: Psychology

## 2023-06-20 ENCOUNTER — Encounter: Payer: Self-pay | Admitting: Psychology

## 2023-06-20 DIAGNOSIS — F4010 Social phobia, unspecified: Secondary | ICD-10-CM

## 2023-06-20 DIAGNOSIS — F909 Attention-deficit hyperactivity disorder, unspecified type: Secondary | ICD-10-CM

## 2023-06-20 NOTE — Progress Notes (Signed)
[facility] Counselor Initial Adult Exam  Name: Tammy Mcclain Date: 06/20/2023 MRN: [id number] DOB: [birth year] PCP: Remus Loffler, PA-C  Time spent: 1:30 - 2:15 pm  Guardian/Informant: [name] - patient    Paperwork requested: No  Met with patient for initial interview.  Patient was at home and session was conducted from therapist's office via video conferencing.  Patient expressed awareness of the limitations related to video sessions and verbally consented to telehealth.    Reason for Visit /Presenting Problem: patient seeking testing for ASD.  She reported always having problems with social interaction and social settings.  Not able to emotional connections outside of her twin sister and husband.  Other seems to interact and read social situations without having to think about while patient must think through every interaction.  People will get offended at her facial expression or word choice.  Patient wishes to understand self better.  Been previously diagnosed with ADHD and anxiety.    Mental Status Exam: Appearance:   Fairly Groomed and Neat     Behavior:  Appropriate and Sharing  Motor:  Normal  Speech/Language:   Clear and Coherent and Normal Rate  Affect:  Appropriate and Full Range  Mood:  euthymic  Thought process:  normal  Thought content:    WNL  Sensory/Perceptual disturbances:    WNL  Orientation:  oriented to person, place, time/date, and situation  Attention:  Good  Concentration:  Good  Memory:  WNL  Fund of knowledge:   Good  Insight:    Good  Judgment:   Good  Impulse Control:  Good   Developmental History: Early delays - Social interaction always been difficult. Was taken from grandmother during age 57 and put in foster care away from sister.  Patient stopped talking and eating until she was reunited with her sister.  Patient's mother died when she was 59.   Motor - Patient stated adequate coordination other than being clumsy and  tripping while walking.  Walks 1 mile per day but no sports.  Good fine motor - legible handwriting and no eye hand coordination problems. Speech - Typical Self Care - Can physically do all activities but sometimes has low motivation to do them.   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  Can support self independently, although she doesn't like to at times.    Social - Struggles over explains often to compensate for coming off overly blunt or rude (not her intention).  Over thinks and analyzes interactions often.    Reported Symptoms:  Falls asleep easily as long as doesn't take ADHD medication to late in the day.  Recent appetite loss but had bariatric surgery during November 2023. Energy is consistent through the day.  No current prolonged sadness or depressed mood but has had these in the past.  Last time 4 years ago when grandmother died. Frequent sudden anxiety/panic but not since taking medication 2-3 years.  Triggered by large groups of people, music with heavy bass, when feels like she not able to move (being told to stop moving).  Anxiety is primarily social - how perceived by others.  No intrusive thought.  No compulsive behavior.  Trouble paying attention, easily distracted, no losing or forgetting, poor organization at home.  Systems at work are organized but not her environment. Restless and fidgety. Verbally impulsive but overly inhibited behaviorally.  Impulsive as a child.  Trouble relating to most peers. Relates better to people that are older.  Adequate  with jokes and sarcasm but trouble reading social-emotional cues.  No close relations outside of sister and husband.  No repetitive speech or behavior. Intense interest in the play/movie Wicked.  Watches ASMR videos to help relax and fall asleep.  Poor with change and transition, especially when change is sudden or unexpected. Overly sensitive to heavy bass music.     Risk Assessment: Danger to Self:  No Self-injurious Behavior:  Used  to hit self when frustrated during childhood. Danger to Others: No Duty to Warn:no Physical Aggression / Violence:No  Access to Firearms a concern: No  Gang Involvement:No  Patient / guardian was educated about steps to take if suicide or homicide risk level increases between visits: n/a While future psychiatric events cannot be accurately predicted, the patient does not currently require acute inpatient psychiatric care and does not currently meet [facility] involuntary commitment criteria.  Substance Abuse History: Current substance abuse: No   History of alcohol/drug abuse on family  Past Psychiatric History:   Previous psychological history is significant for ADHD, anxiety, and depression Outpatient Providers:[facility].  Has had therapists in the past but not currently seeing anyone.   History of Psych Hospitalization: No  Psychological Testing: Attention/ADHD:  and anxiety    Abuse History:  Victim of: No.,  None    Report needed: No. Victim of Neglect:No. Perpetrator of  None   Witness / Exposure to Domestic Violence:  Mother murdered when patient was 53 years old.   Protective Services Involvement:  Patient put into foster care and separated from twin sister at 45 years of age. Later reunited Witness to Community Violence:  No   Family History: No family history on file. Mother was in an and out of jail as was father. Older sister diagnosed with Bipolar.  Son diagnosed with ADHD.     Living situation: the patient lives with their spouse and their two children (son 96 and daughter 2 years).  Very close with twin sister.  Not close with older sister.  Good relations with husband and children overall.  Sexual Orientation: Straight  Relationship Status: married  Name of spouse / other:[name] If a parent, number of children / ages:8 & 2.  Support Systems: spouse Twin sister  Surveyor, quantity Stress:  Yes  Job is low paying and husband's business  (owning trucks) is inconsistent.    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 Excels at her job.  Always perform well with jobs.  Has flexible work schedule as long as gets work done.   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 Did well in school.  Recreation/Hobbies: Plays games on phone.  Spending time with her children.    Stressors: Financial difficulties   Behind on bill payments  Strengths: Pattern recognition.  Finding out what is not working and fixing it.  Empathy - understands why people are upset or why they react certain ways.  Barriers:  Anxiety with how being perceived by others.  Over thinking sand over analyzing interactions with others.  Blindness to how what she does is being perceived by others.  Will not know she bothers others unless they tell her directly.     Legal History: Pending legal issue / charges: The patient has no significant history of legal issues.  Medical History/Surgical History: reviewed Past Medical History:  Diagnosis Date   Kidney stones 05/22/2014  Overweight  Past Surgical History:  Procedure Laterality Date   CESAREAN SECTION N/A 04/22/2015   Procedure: CESAREAN SECTION;  Surgeon: Lavina Hamman, MD;  Location: [facility];  Service: Obstetrics;  Laterality: N/A;  Bariatric Surgery - November 2023  Medications: Current Outpatient Medications  Medication Sig Dispense Refill   cyclobenzaprine (FLEXERIL) 10 MG tablet Take 1 tablet (10 mg total) by mouth 2 (two) times daily as needed for muscle spasms. 20 tablet 0   ibuprofen (ADVIL,MOTRIN) 600 MG tablet Take 1 tablet (600 mg total) by mouth every 6 (six) hours as needed. 30 tablet 0   Prenatal Vit-Fe Fumarate-FA (PRENATAL MULTIVITAMIN) TABS tablet Take 1 tablet by mouth daily at 12 noon. 100 tablet 4   No current facility-administered  medications for this visit.  Current - Strattera, Sertraline, Adderall along with bariatric multivitamin and birth control patch.  Allergies  Allergen Reactions   Oxycodone Itching  No digestive problems, concussions, seizures, or head injuries.    Diagnoses:  Social anxiety disorder  Attention deficit hyperactivity disorder (ADHD), unspecified ADHD type  Plan of Care: Patient presents with a history of social interaction difficulty, resulting in much social anxiety.  She has been previously diagnosed with ADHD but reported problems with understanding nonverbal social cues, overly intense interests, difficulty adjusting to change/transition, and hyper-sensitivity to noise.  Testing recommended to evaluate for autism spectrum disorder along with others conditions that may be affecting social interaction and behavior.    Test Battery -  K-BIT 2R, CNSVS, BRIEF-2A, CAARS-2, Adult OCD, DASS, PTSD Checklist, ADOS-2 Module 4, SRS-2 (S & O).  Bryson Dames, PhD

## 2023-06-20 NOTE — Progress Notes (Signed)
Tammy Dames, PhD

## 2023-07-02 ENCOUNTER — Encounter: Payer: Self-pay | Admitting: Psychology

## 2023-07-02 ENCOUNTER — Ambulatory Visit: Payer: No Typology Code available for payment source | Admitting: Psychology

## 2023-07-02 DIAGNOSIS — F909 Attention-deficit hyperactivity disorder, unspecified type: Secondary | ICD-10-CM

## 2023-07-02 DIAGNOSIS — F908 Attention-deficit hyperactivity disorder, other type: Secondary | ICD-10-CM

## 2023-07-02 DIAGNOSIS — F4010 Social phobia, unspecified: Secondary | ICD-10-CM | POA: Diagnosis not present

## 2023-07-02 NOTE — Progress Notes (Signed)
   Bryson Dames, PhD

## 2023-07-02 NOTE — Progress Notes (Signed)
 [facility] Counselor/Therapist Progress Note  Patient ID: Tammy Mcclain, MRN: [id number],    Date: 07/02/2023  Time Spent: 12:30 - 2:45 pm   Treatment Type: Testing  Met with patient for testing session.  Patient was at the clinic and session was conducted from therapist's office in person.    Reported Symptoms/Reason for Referral: Patient presents with a history of social interaction difficulty, resulting in much social anxiety. She has been previously diagnosed with ADHD but reported problems with understanding nonverbal social cues, overly intense interests, difficulty adjusting to change/transition, and hyper-sensitivity to noise. Testing recommended to evaluate for autism spectrum disorder along with others conditions that may be affecting social interaction and behavior.   Mental Status Exam: Appearance:  Casual and Fairly Groomed     Behavior: Appropriate  Motor: Normal  Speech/Language:  Clear and Coherent and Normal Rate  Affect: Appropriate  Mood: euthymic  Thought process: normal  Thought content:   WNL  Sensory/Perceptual disturbances:   WNL  Orientation: oriented to person, place, time/date, and situation  Attention: Good  Concentration: Good  Memory: WNL  Fund of knowledge:  Good  Insight:   Good  Judgment:  Good  Impulse Control: Good   Risk Assessment: Danger to Self:  No Self-injurious Behavior: No Danger to Others: No Duty to Warn:no Physical Aggression / Violence:No   Subjective: Testing included the K-BIT 2R (0.75 hrs. for testing and scoring) along with the CNS Vital signs (0.75 hrs.), BRIEF-2A (0.25 hrs.), CAARS-2 (0.25 hrs.), and SRS-2 (0.25 hrs.).  The SRS-2 was sent to patient's adult sister to complete online prior to next session.    Patient was cooperative and displayed good effort. Attention and concentration were adequate overall, although patient exhibited several instances of self-correction and asking for questions to be  repeated.  Mood was euthymic with appropriate affect.  The results appear representative of current functioning.    Diagnosis:Social anxiety disorder  Attention deficit hyperactivity disorder (ADHD), unspecified ADHD type  Plan: Testing to continue next session with the ADOS 2 Module 4 followed by report writing and interactive feedback.     Mcclain Scarberry, PhD

## 2023-07-04 ENCOUNTER — Ambulatory Visit: Payer: No Typology Code available for payment source | Admitting: Psychology

## 2023-07-04 ENCOUNTER — Encounter: Payer: Self-pay | Admitting: Psychology

## 2023-07-04 DIAGNOSIS — F908 Attention-deficit hyperactivity disorder, other type: Secondary | ICD-10-CM | POA: Diagnosis not present

## 2023-07-04 DIAGNOSIS — F4010 Social phobia, unspecified: Secondary | ICD-10-CM

## 2023-07-04 DIAGNOSIS — F909 Attention-deficit hyperactivity disorder, unspecified type: Secondary | ICD-10-CM

## 2023-07-04 NOTE — Progress Notes (Signed)
[facility] Counselor/Therapist Progress Note  Patient ID: Tammy Mcclain, MRN: [id number],    Date: 07/04/2023  Time Spent: 12:30 - 2:15 pm   Treatment Type: Testing  Met with patient for testing session.  Patient was at the clinic and session was conducted from therapist's office in person.    Reported Symptoms/Reason for Referral: Patient presents with a history of social interaction difficulty, resulting in much social anxiety. She has been previously diagnosed with ADHD but reported problems with understanding nonverbal social cues, overly intense interests, difficulty adjusting to change/transition, and hyper-sensitivity to noise. Testing recommended to evaluate for autism spectrum disorder along with others conditions that may be affecting social interaction and behavior.   Mental Status Exam: Appearance:  Casual and Fairly Groomed     Behavior: Appropriate  Motor: Normal  Speech/Language:  Clear and Coherent and Normal Rate  Affect: Appropriate  Mood: euthymic  Thought process: normal  Thought content:   WNL  Sensory/Perceptual disturbances:   WNL  Orientation: oriented to person, place, time/date, and situation  Attention: Good  Concentration: Good  Memory: WNL  Fund of knowledge:  Good  Insight:   Good  Judgment:  Good  Impulse Control: Good   Risk Assessment: Danger to Self:  No Self-injurious Behavior: No Danger to Others: No Duty to Warn:no Physical Aggression / Violence:No   Subjective: Testing included the ADOS 2 Module 4 (1.75 hrs. for testing and scoring).  The SRS-2 was completed by patient's adult sister online prior to the session.    Patient was cooperative and displayed good effort. Attention and concentration were adequate overall, although patient exhibited several instances of self-correction and asking for questions to be repeated.  Mood was euthymic with appropriate affect.  Patient initiated interaction only one time but was  responsive to social advances.  The results appear representative of current functioning.    Diagnosis:Social anxiety disorder  Attention deficit hyperactivity disorder (ADHD), unspecified ADHD type  Plan: Testing complete. Report writing to be conducted followed by interactive feedback next session.       Bryson Dames, PhD                  Bryson Dames, PhD

## 2023-07-09 ENCOUNTER — Encounter: Payer: Self-pay | Admitting: Psychology

## 2023-07-09 NOTE — Progress Notes (Signed)
 Tammy Mcclain is a 28 y.o. female patient Report writing competed ( 3 hrs.).  Interactive feedback to be conducted next session. Report to be attached to the feedback progress note.  Patient/Guardian was advised Release of Information must be obtained prior to any record release in order to collaborate their care with an outside provider. Patient/Guardian was advised if they have not already done so to contact the registration department to sign all necessary forms in order for [place] to release information regarding their care.   Consent: Patient/Guardian gives verbal consent for treatment and assignment of benefits for services provided during this visit. Patient/Guardian expressed understanding and agreed to proceed.    Bryson Dames, PhD

## 2023-07-16 ENCOUNTER — Encounter: Payer: Self-pay | Admitting: Psychology

## 2023-07-16 ENCOUNTER — Ambulatory Visit: Payer: No Typology Code available for payment source | Admitting: Psychology

## 2023-07-16 DIAGNOSIS — F840 Autistic disorder: Secondary | ICD-10-CM

## 2023-07-16 DIAGNOSIS — F411 Generalized anxiety disorder: Secondary | ICD-10-CM

## 2023-07-16 DIAGNOSIS — F908 Attention-deficit hyperactivity disorder, other type: Secondary | ICD-10-CM

## 2023-07-16 NOTE — Progress Notes (Signed)
 Psychological Testing Report - Confidential  Identifying Information:              Patient's Name:   Tammy Mcclain  Date of Birth:   09-Jan-1996     Age:                28 years  MRN#:                                   [id number]      Dates of Assessment:  February 10 & 12, 2024         Purpose of Evaluation:  The purpose of the evaluation is to provide diagnostic information and treatment recommendations.  Ms. Tammy Mcclain was the primary informant for this evaluation.   Referral Information: Ms. Tammy Mcclain was a 28 year old [place] Caucasian female seeking testing for Autism Spectrum Disorder (ASD).  She reported always having problems with social interaction and social settings.  Ms. Tammy Mcclain stated that she is not able to make emotional connections outside of her twin sister and her husband.  She believes that others seem to interact and read social situations naturally while Ms. Tammy Mcclain must think through every interaction.  People often get offended at her facial expression or word choice when she is not meaning to be rude.  Ms. Tammy Mcclain wishes to understand herself better.  She has been previously diagnosed with Attention Deficit Hyperactivity Disorder (ADHD) and anxiety.                Relevant Background Information:  Developmental history was reported to be significant for social interaction delays.  Regarding early development, Ms. Tammy Mcclain stated that early developmental milestones were on time.  However, social interaction has always been difficult. She was taken from her grandmother during age 28 and put in foster care away from her twin sister.  Ms. Tammy Mcclain stated that she stopped talking and eating until she was reunited with her sister.  She continued to struggle socially after reunification.  Regarding current development,  Ms. Tammy Mcclain reported having adequate coordination other than being clumsy and tripping while walking.  She walks one mile per day but no does not play any sports.   Fine motor skills were reported to be well developed with typical handwriting and hand-eye coordination.  Speech was reported to be typical.  Ms. Tammy Mcclain indicated that she can physically do all self-care activities but sometimes has low motivation to do them.   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  She can support herself independently, although she doesn't like to do this at times.  Socially, Ms. Tammy Mcclain stated that she struggles with over-explaining, often to compensate for coming off overly blunt or rude (not her intention).  She overthinks and analyzes interactions often.       Medical history was reported to be significant for Kidney Stones and Obesity.  Surgical history includes Cesarean Section (2016) and Bariatric Surgery (2023).  An allergy to Oxycodone was reported, while digestive problems, concussions, seizures, or head injuries were denied.  Current medication use includes Strattera, Sertraline, and Adderall along with a bariatric multivitamin and birth control patch.  Recreational substance use was denied, as there is a history of alcohol/drug abuse within her family.  Previous psychological history is significant for ADHD, anxiety, and depression.  Ms. Tammy Mcclain sees [facility] for psychiatry.  She has had therapists in the past but is not  currently seeing anyone for psychotherapy.  A history of psychiatric hospitalization denied.  Previous psychological testing was reported yielding diagnoses of ADHD and generalized anxiety.      Educationally, Ms. Tammy Mcclain reported that she completed her master's degree in business administration.  She reported always performing well in school.  Ms. 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  She reported excelling at her job and performing well with previous employment opportunities.  Her current position has a flexible work schedule provided she gets her work done, which suits her well.  Leisure  activities include playing games on her phone and spending time with her children.  Ms. Tammy Mcclain currently lives with her family, consisting of her husband Maxwell Caul and their two children (son 62 and daughter 2 years).  She reported being very close with her twin sister but more distant with her older sister.  Good relations were reported with her husband and children overall.  Her husband and twin sister are her primary support people.  Ms. Tammy Mcclain identifies as a [employer] gender straight female.  Family mental health history was reported to be significant for her older sister being diagnosed with Bipolar Disorder and her son having ADHD.  Childhood history of was reported to be significant for her parents being in and out of jail often.  Ms. Tammy Mcclain was placed into foster care and separated from twin sister at 10 years of age.  She was later reunited with her sister, but her mother was murdered when Ms. Tammy Mcclain was 28 years old.  Current stressors include financial difficulties, as she is behind on bill payments.  Strengths include pattern recognition, especially finding out what is not working and fixing it.   She can understand why people are upset or why they react certain ways.  Barriers to success include excessive anxiety with how she is perceived by others.  She typically overthinks and over analyzes interactions with others.  She often does not know how her actions are being perceived by others, not knowing if she is bothering others unless they tell her directly.   . Presenting Symptomology:  Ms. Tammy Mcclain reported that she falls asleep easily provided doesn't take her ADHD medication too late in the day.  Recent appetite loss was reported, since having bariatric surgery during November 2023.  Her energy was reported to be consistent throughout the day.  Current episodes of prolonged sadness or depressed mood were denied, but she has had these in the past.  The last time was 4 years ago when her  grandmother died. Frequent sudden anxiety/panic attacks were reported, but she has not experienced these since taking medication 2-3 years ago.  Her anxiety is triggered by large groups of people, music with heavy bass, feeling like she not able to move, or being told to stop moving.  Current anxiety was reported to be primarily social (how she is perceived by other).  Intrusive thought and compulsive behavior were denied.  Ms. Veazie reported having trouble paying attention and easily distracted.  She does not exhibit frequent losing or forgetting but has poor organization at home.  Her workflow systems are organized but not her work environment.  She reported being frequently restless and fidgety. She can be verbally impulsive but overly inhibited behaviorally.  She reported being more impulsive as a child. Ms. Ferger reported having trouble relating to most peers, interacting better with people that are older.  She adequately understands jokes and sarcasm but has trouble reading  social-emotional cues.  Close relations outside of her twin sister and husband were denied.  She does not engage in repetitive speech or behavior.  Overly intense interests currently revolve around the play/movie Wicked.  She   watches ASMR videos to help relax and fall asleep.  Ms. Mcfarland reported adjusting poorly to change and transition, especially when change is sudden or unexpected. She indicated being overly sensitive to heavy bass music.  Procedures Administered: Carlos American Intelligence Test - 2 CNS Vital Signs Behavior Rating Inventory for Executive Function - Adult Self-Report   Connors Adult ADHD Rating Scale 2 (CAARS-2) - Self and Other Report  Adult OCD Inventory - Self Report Depression Anxiety and Stress scale - Self Report DSM 5 - PTSD Checklist - Self Report  Autism Diagnostic Observation Schedule (ADOS 2)- Module 4 Social Responsiveness Scale -2 (Self and Other report)  Behavioral Observations:  Ms. Better  was cooperative and displayed good effort. Attention and concentration were adequate overall, although Ms. Avitia exhibited several instances of self-correction and asking for questions to be repeated.  Mood was euthymic with appropriate affect.  Ms. Linam initiated interaction only one time but was responsive to social advances.  The results appear representative of current functioning.   Ms. Strathman was casually dressed and adequately groomed.  Brief mental status indicated typical general orientation and alertness.  Memory appeared typical.  Knowledge, judgement, and insight were good.  Current hallucinations, delusions, and thoughts of self-harm were denied.    Test Results and Interpretation:   General Intellectual Functioning:  The K-BIT 2 was used to assess Ms. Nation's performance across two areas of cognitive ability. When interpreting these scores, it is important to view the results as a snapshot of current intellectual functioning. As measured by the K-BIT 2, Ms. Knaus's Composite IQ score fell within the average range when compared to same age peers (CIQ = 106).  Ms. Diekman performance was consistent across the Primary Index Scores, as Verbal Comprehension (VCI = 103) was average and relatively equal to Perceptual Reasoning (PRI = 107, average).  The difference was not statistically significant.  This indicates age typical language understanding and visual learning ability.  On individual subtests, Ms. Ethridge performed within the average range for verbal knowledge and inferential thinking (riddles), indicating relatively even performance among verbal abilities.  Visual pattern analysis (matrices) was average.  Overall, Ms. Leidner appears to have adequately developed reasoning skills.  Carlos American Brief Intelligence Test - 2 Composite Score Summary  Composite Scores  Sum of Raw Scores Standard Score Percentile Rank 90% Confidence Interval Qualitative Description  Verbal Comprehension   VC  90         103  58  98-108  Average  Nonverbal Reasoning PR 41 107 68 100-113 Average  Composite IQ  FSIQ - 106 66 102-110 Average    K-BIT 2 Continued Domain Subtest Name  Total Raw Score Scaled Score Percentile Rank  Verbal Verbal Knowledge VK 51  10 50  Comprehension Riddles Ri 39  11 63   Attention and Processing:  The results of the CNS Vital Signs testing indicated below average overall neurocognitive processing ability, at a level significantly lower than measured comprehension ability (average).  Regarding areas related to attention problems, simple attention was average and sustained attention was high average.  However, complex attention was below average due to very low cognitive flexibility and executive function.  These are the domains most closely associated with attention deficits.  Psychomotor speed was average, with average processing speed,  but low reaction time, indicating typical thinking speed and coordinated movement on computerized measures, but slow responsiveness.  Visual memory was below average, with average verbal memory, indicating greater ability to remember words than images.  Working [employer] fast streamer (used for multi-tasking and problems solving) was a strength in the above average range.  Recognizing emotional expression was ag typical as the Social Acuity domain was average.  The results suggest that Ms. Golebiewski appears to have adequate ability attending to simple but not complex activities with impaired other processing abilities including shifting attention and attending systematically.  Memory was typical overall with adequate motor and thinking speed when taking her medication.  Slow reactions indicate hesitancy in responding, which could be related to anxiety or over thinking.  The validity scales indicated a valid profile for all measures.                                                          CNS Vital Signs   Domain Scores Standard Score Percentile Above  Average Low Average Low Very Low  Neurocognition Index (NCI) 83 13   X    Composite Memory 90 25  X     Verbal Memory 104 61  X     Visual Memory 82 12   X    Psychomotor Speed 102 55  X     Reaction Time* 75 5    X   Complex Attention* 83 13   X    Cognitive Flexibility 63 1     X  Processing Speed \\97  42  X     Executive Function 66 1     X  Social Acuity 98 45  X     Working Memory 119 90 X      Sustained Attention 113 81 X      Simple Attention 96 40  X     Motor Speed 106 66  X      Executive Function: Ms. Fant completed the Self-Report Form of the Behavior Rating Inventory of Executive Function, Second Edition-Adult Version (BRIEF2A) on 07/02/2023. There are no missing item responses in the protocol.  Responses are reasonably consistent. Ms. Mahan ratings of herself do not appear overly negative. There was one atypical response to infrequently endorsed items. In the context of these validity considerations, ratings of Berda's executive function exhibited in everyday behavior indicate some areas of concern. The overall index score, the GEC, was mildly elevated (GEC T = 65, %ile = 87). The Behavior Regulation Index (BRI) score was within normal limits (BRI T = 58, %ile = 82), but the Emotion Regulation Index (ERI) score was mildly elevated (ERI T = 67, %ile = 91) as was the Cognitive Regulation Index (CRI) score (CRI T = 66, %ile = 88).  Within these summary indicators, all the individual scales can be calculated. One or more of the individual BRIEF2A scale T scores were elevated, suggesting that Ms. Riquelme exhibits difficulty with some aspects of executive function. Concerns are noted with her ability to adjust well to changes and keep materials and belongings reasonably well-organized. Ms. Bier ability to resist impulses, be aware of her functioning in social settings, react to events appropriately, get going on tasks and activities and independently generate ideas,  sustain working memory, plan and organize her approach to problem  solving appropriately and be appropriately cautious in her approach to tasks and check for mistakes was not described as problematic.  Ms. Peugh elevated scores on scales reflecting problems with fundamental behavioral and/or emotional regulation (Inhibit, Emotional Control, and Shift) suggest that more global problems with self-regulation are having a negative effect on active cognitive problem solving (elevated CRI).  Essentially, one needs to be appropriately inhibited, flexible, and well-modulated emotionally for efficient, systematic, and organized problem-solving to take place.   BRIEF2A Self-Report Form Score Summary Scale/Index/Composite Raw score T score Percentile 90% CI  Inhibit 12 52 67 44-60  Self-Monitor 11 64 91 57-71  Behavior Regulation Index (BRI) 23 58 82 52-64  Shift 14 70 95 63-77  Emotional Control 15 60 83 56-64  Emotion Regulation Index (ERI) 29 67 91 62-72  Initiate 17 64 88 58-70  Working Memory 16 62 87 56-68  Plan/Organize 15 58 77 52-64  Task-Monitor 11 59 82 51-67  Organization of Materials 18 68 96 62-74  Cognitive Regulation Index (CRI) 77 66 88 63-69  Global Executive Composite (GEC) 129 65 87 62-68   Behavior and Social-Emotional Functioning: Self-report ratings of attention and behavior regulation indicated mildly high ADHD related symptoms while taking medication.  On the CAARS 2 Self Report, Ms. Lopez positively endorsed, as occurring often or very often, 4 of 9 items for inattention/poor organization and 4 of 9 items for hyperactivity and poor impulse control.  Endorsement of at least 5 items in either category is considered at-risk for ADHD.  T-scores indicated a mild elevation regarding activity level with typical functioning regarding attention/executive function, impulse control, emotion regulation, and self-concept.  CAARS 2 SCALES   Content Scales                       T Score      90% CI     Percentile   Guideline          Elevated Items  Inattention/ Executive Dysfunction 58 55-61 80th Not Elevated 9/30  Hyperactivity 64 60-68 87th Slightly Elevated 6/13  Impulsivity 55 50-60 76th Not Elevated 2/13  Emotional Dysregulation 55 50-60 80th Not Elevated 2/9  Negative Self-Concept 57 52-62 75th Not Elevated 2/7  Note(s). CI = Confidence Interval.      CAARS-2 - Continued  T-score 90% CI Percentile Guideline Symptom Count ?  ADHD Inattentive Symptoms 59 55-63 83rd Not Elevated 4/9  ADHD Hyperactive/ Impulsive Symptoms 61 56-66 83rd Slightly Elevated 4/9  Total ADHD Symptoms 60 55-65 85th Slightly Elevated n/a   Other Self-report measures indicated mild to moderate emotional distress.  Ms. Tweten responses on the Adult OCD Inventory indicated minimal levels of obsessive-compulsive tendencies, with her total score falling within the 'Not a problem' range.  Three of the 20 individual items were highly endorsed (occurring often or very often), including difficulty with decision making, anger when others arrange items on her desk, and eating the same foods repeatedly.  Additionally, Ms. Wardrop scores on the Depression, Anxiety, and Stress Scale indicated a mild elevation for depression with moderately high stress and a severely high anxiety score.  Nine of the 21 items were rated as occurring frequently (often or very often) including trouble winding down, dry mouth, loss of pleasure, lack of initiative, emotional over-reaction, trembling hands, nervous energy, fear of embarrassment, and agitation.  Finally, ratings for trauma related symptoms indicated mild overall difficulty.  On the PTSD Checklist, moderate or greater difficulty was rated regarding avoiding reminders  of the trauma and negative affect with a mild to moderate elevation regarding intrusive recollections.  Hypervigilance was not elevated.     Regarding symptoms of ASD, information from the ADOS 2 Module 4  indicated difficulty with some aspects of social communication and reciprocal social interaction, with no instances of restricted-repetitive behavior observed during this administration.  Her overall severity score indicated a low likelihood of ASD, although adults often mask atypical behavior during these observations.  Within the area of communication, Ms. Preece spoke in complete sentences.  There was typical variation in her tone of voice and there was no observation of echolalia or repetitive speech.  She adequately reported routine and nonroutine events, while frequently offering spontaneous personal information.  Ms. Kavan responded adequately to personal information from the examiner with related comments but did not ask any socially related questions.  Reciprocal conversation appeared typically developed for her age and cognitive level.  She demonstrated adequate use of descriptive, emphatic, and emotional gestures.  Socially, Ms. Weill struggled to establish and maintain eye contact.  Her range of facial expression seemed limited, although she frequently directed facial expression to the examiner.  Her expression of enjoyment in interaction appeared typical during multiple discussions (pets and trips).  Ms. Ferrante exhibited good ability elaborating on personal emotions but needed prompting to identify emotions in others during pictures or other activities.  Ms. Reining demonstrated good insight into the nature of social relationships but had difficulty describing her own role in these relationships.  Her engagement in adult independent activities seemed age typical.  Ms. Mohammed initiated interaction one time, which was related to the activity.  She was frequently responsive to social advances from the examiner, but the responses lacked adequate reciprocity/social interest.  Social rapport was adequately established and maintained despite her social communication difficulty, as Ms. Peters appeared  relaxed and was appropriately engaged.  Ms. Kell demonstrated literal object use with limited storytelling, given her cognitive abilities.  Regarding behavior, Ms. Krolikowski did not demonstrate any odd sensory seeking behavior, odd movement, or self-injury.  Compulsive behavior was also not observed, and Ms. Mower did not discuss any overly intense interests.    Ms. Smits completed the Social Responsiveness Scale - 2 (SRS-2) regarding ASD related behavior.  On this measure, the T Score of 81 was in the Severely Elevated range. Scores in this range indicate deficiencies in reciprocal social behavior that are clinically significant and lead to severe and enduring interference with everyday social interactions. Such scores are strongly associated with clinical diagnosis of an autism spectrum disorder.  The scores for social communication and restricted repetitive behavior were both severely elevated.    Social Responsiveness Scale - 2 - Self Report                                Awr             Cog             Com           Mot            RRB Raw                         17                23  37              24               28                             T-score                    81                77                74              77               87  Awr = Social Awareness    Com = Social Museum/gallery exhibitions officer = Social Cognition     Mot = Social Motivation  RRB = Restricted Interests and Repetitive Behavior  DSM-5 Compatible Subscales Raw score T-score  Social Communication and Interaction        101   79    Restricted Interests and Repetitive Behavior         28   87      Ms. White's twin sister Miryah Ralls also completed the Social Responsiveness Scale - 2 (SRS-2) regarding her sister's behavior.  On this measure, the T Score of 63 was within the Mildly Elevated range. Scores in this range indicate deficiencies in reciprocal social behavior that are clinically significant and  may lead to mild to moderate interference with everyday social interactions.  Such scores are just below those associated with clinical diagnosis of an autism spectrum disorder.  The score for social communication was mildly elevated.  However, the score for restricted repetitive behavior was moderately elevated, which is clinically significant.    Social Responsiveness Scale - 2 - Other Report                                Awr             Cog             Com           Mot            RRB Raw score               11                 9                 26              16                16                              T-score                    64               53                 63              64  67  Awr = Social Awareness    Com = Social Museum/gallery exhibitions officer = Social Cognition    Mot = Social Motivation  RRB = Restricted Interests and Repetitive Behavior   Social Responsiveness Scale - 2 - Other Report - Continued  DSM-5 Compatible Subscales Raw score T-score  Social Communication and Interaction         62   62    Restricted Interests and Repetitive Behavior        16   67      Summary: Ms. Carvell was evaluated during February 2025 related to suspicion of an Autism Spectrum Disorder (ASD).  Ms. Reisz presents with a history of social interaction difficulty, resulting in much social anxiety. She has been previously diagnosed with ADHD but reported problems with understanding nonverbal social cues, overly intense interests, difficulty adjusting to change/transition, and hyper-sensitivity to noise. Testing was recommended to evaluate for ASD along with other conditions that may be affecting social interaction and behavior.  Test results indicated average overall comprehension ability (K-BIT 2R), with relatively equally developed Nonverbal Reasoning and Verbal Comprehension.  Testing for neurocognitive processing indicated low average overall processing with average functioning regarding  attention for simple activities but great difficulty regarding cognitive flexibility, executive function, and reaction time, suggesting difficulty with complex processing along with much hesitancy in responding.  Ratings for executive function indicated moderately impaired functioning overall with typical behavior regulation, but moderate difficulty regarding emotion and cognitive regulation.  Shifting attention and organizing materials were especially high.  Ratings for behavior functioning by Ms. Lenger indicated a moderately elevated score regarding hyperactivity with age typical levels of attention, organization, impulse control, and emotion regulation, indicating that ADHD are adequately controlled with medication.  Self-report ratings for emotional functioning indicated mild to moderate current emotional distress, with a moderate to severe elevation in stress/anxiety.  Depression and PTSD symptoms were mildly elevated, while OCD symptoms were not problematic. Direct testing for ASD indicated some difficulty with reciprocal social interaction and nonverbal communication, with no instance of restricted repetitive behavior observed during testing.  On the other hand, self-report ratings indicated severe impairment regarding social communication and restricted-repetitive behavior (RRB), while ratings from Ms. Augello's twin sister indicated mild social communication difficulty with moderately impaired restricted repetitive behavior, suggesting much masking may be involved outside of people who know her intimately.  The results meet the criteria for ASD as social communication impairment and restricted-repetitive behavior were reported across settings.  Criterion continued to be met for ADHD, although severity is currently below clinically significant levels due to medication.  Previous diagnoses of anxiety disorders also seem relevant currently, while depressive, obsessive-compulsive, and traumatic stress  symptoms were reported to not cause clinically significant impairment at this time.  Recommendations include discussing results with primary care physician or psychiatrist, continuing medication resuming individual counseling, and accessing appropriate social supports.  See below for further recommendations.        Diagnostic Impression: DSM 5  Autism Spectrum Disorder - Level 1 - Needs Support Other Specified Attention Deficit Hyperactivity Disorder - Currently below clinically significant levels due to medication  Generalized Anxiety Disorder   Recommendations: Recommendations are to discuss results with Primary Care Physician.  Medication to address attention, along with anxiety and depressed mood, is recommended to continue.   Individual counseling is recommended to continue to help Ms. Fallaw learn approaches to manage her anxiety and depressed mood, while understanding more about strategies to better regulate her emotion and thought.  Emphasis is  on helping Ms. Huneke understand ASD affects her social interaction and behavior in relation to her anxiety and adult navigational skills.  See below for further strategies to improve executive function and ADHD.    Mental alertness/energy can also be raised by increasing exercise, improving sleep, eating a healthy diet, and managing depression/stress.  Consult with a physician regarding any changes to physical regimen. See recourse list below for appropriate resources along with social and community support groups for adults with ASD.       Salvatore Decent Harmonee Tozer, Ph.D. Licensed Psychologist - [facility] Licensed psychologist [phone]               Bryson Dames, PhD

## 2023-07-16 NOTE — Progress Notes (Signed)
   Tammy Dames, PhD

## 2023-07-16 NOTE — Progress Notes (Signed)
 [facility] Counselor/Therapist Progress Note  Patient ID: Tammy Mcclain, MRN: [id number],    Date: 07/16/2023  Time Spent: 4:00 - 4:40 pm   Treatment Type:  Testing - Feedback Session  Met with patient to review results of testing.  Patient was at home and session was conducted from therapist's office via video conferencing.  Patient understood the limitations of video appointments and verbally consented to telehealth.       Reported Symptoms: presents with a history of social interaction difficulty, resulting in much social anxiety. She has been previously diagnosed with ADHD but reported problems with understanding nonverbal social cues, overly intense interests, difficulty adjusting to change/transition, and hyper-sensitivity to noise. Testing recommended to evaluate for autism spectrum disorder along with others conditions that may be affecting social interaction and behavior.   Subjective: Interactive feedback was conducted (1 hr.).  It was discussed how patient met the criterion for Autism Spectrum Disorder and ADHD along with how these conditions affect her ability to process information and relate to others. Recommendations included discussing results with PCP, developing a visual organization system, continuing individual counseling and seeking appropriate community supports.  Patient expressed agreement with the results and recommendations.     Total Time of Testing: 8 hrs. Testing and Scoring: 4 hrs. Interactive Feedback:1 hr. Report Writing: 3 hrs.   Diagnosis: Autism Spectrum Disorder - Level 1 - Needs Support Other Specified Attention Deficit Hyperactivity Disorder - Currently below clinically significant levels due to medication  Generalized Anxiety Disorder   Plan: Report to be sent to patient and referring provider.     Bryson Dames, PhD
